# Patient Record
Sex: Male | Born: 1955 | Race: White | Hispanic: No | Marital: Married | Smoking: Never smoker
Health system: Southern US, Community
[De-identification: ages and names within clinical notes are randomized; demographics above are authoritative.]

## PROBLEM LIST (undated history)

## (undated) DIAGNOSIS — K5790 Diverticulosis of intestine, part unspecified, without perforation or abscess without bleeding: Secondary | ICD-10-CM

## (undated) DIAGNOSIS — G4730 Sleep apnea, unspecified: Secondary | ICD-10-CM

## (undated) DIAGNOSIS — K759 Inflammatory liver disease, unspecified: Secondary | ICD-10-CM

## (undated) DIAGNOSIS — M1990 Unspecified osteoarthritis, unspecified site: Secondary | ICD-10-CM

## (undated) DIAGNOSIS — K859 Acute pancreatitis without necrosis or infection, unspecified: Secondary | ICD-10-CM

## (undated) DIAGNOSIS — I10 Essential (primary) hypertension: Secondary | ICD-10-CM

## (undated) HISTORY — PX: EYE SURGERY: SHX253

## (undated) HISTORY — PX: TONSILLECTOMY: SUR1361

## (undated) HISTORY — PX: KNEE ARTHROSCOPY: SHX127

## (undated) HISTORY — PX: VASECTOMY: SHX75

## (undated) HISTORY — PX: COLONOSCOPY: SHX5424

---

## 1997-09-04 ENCOUNTER — Ambulatory Visit (HOSPITAL_COMMUNITY): Admission: RE | Admit: 1997-09-04 | Discharge: 1997-09-04 | Payer: Self-pay | Admitting: Gastroenterology

## 2000-12-14 ENCOUNTER — Ambulatory Visit (HOSPITAL_COMMUNITY): Admission: RE | Admit: 2000-12-14 | Discharge: 2000-12-14 | Payer: Self-pay | Admitting: Gastroenterology

## 2000-12-15 ENCOUNTER — Encounter: Payer: Self-pay | Admitting: *Deleted

## 2000-12-15 ENCOUNTER — Observation Stay (HOSPITAL_COMMUNITY): Admission: EM | Admit: 2000-12-15 | Discharge: 2000-12-15 | Payer: Self-pay | Admitting: *Deleted

## 2013-03-05 ENCOUNTER — Ambulatory Visit (HOSPITAL_BASED_OUTPATIENT_CLINIC_OR_DEPARTMENT_OTHER): Payer: 59

## 2013-06-03 ENCOUNTER — Ambulatory Visit (HOSPITAL_BASED_OUTPATIENT_CLINIC_OR_DEPARTMENT_OTHER): Payer: 59 | Attending: Family Medicine

## 2013-06-03 DIAGNOSIS — R0683 Snoring: Secondary | ICD-10-CM

## 2013-06-03 DIAGNOSIS — G4733 Obstructive sleep apnea (adult) (pediatric): Secondary | ICD-10-CM | POA: Insufficient documentation

## 2013-06-09 DIAGNOSIS — G4730 Sleep apnea, unspecified: Secondary | ICD-10-CM

## 2013-06-09 DIAGNOSIS — R0989 Other specified symptoms and signs involving the circulatory and respiratory systems: Secondary | ICD-10-CM

## 2013-06-09 DIAGNOSIS — R0609 Other forms of dyspnea: Secondary | ICD-10-CM

## 2013-06-09 DIAGNOSIS — G4710 Hypersomnia, unspecified: Secondary | ICD-10-CM

## 2013-06-09 DIAGNOSIS — G4733 Obstructive sleep apnea (adult) (pediatric): Secondary | ICD-10-CM

## 2013-06-09 NOTE — Sleep Study (Signed)
   NAME: Russell Carney DATE OF BIRTH:  [birth year] MEDICAL RECORD NUMBER [id number]  LOCATION: [facility]  PHYSICIAN: Clinton D Young  DATE OF STUDY: 06/03/2013  SLEEP STUDY TYPE: Nocturnal Polysomnogram               REFERRING PHYSICIAN: Koirala, Dibas, MD  INDICATION FOR STUDY: Hypersomnia with sleep apnea  EPWORTH SLEEPINESS SCORE:   HEIGHT:   69 inches WEIGHT:   235 pounds  BMI 35 NECK SIZE:.  MEDICATIONS: Charted for review  SLEEP ARCHITECTURE: Total sleep time 267 minutes with sleep efficiency 70.6%. Stage I was 12.9%, stage II 76.4%, stage III absent, REM 10.7% of total sleep time. Sleep latency 16 minutes, REM latency 100.5 minutes, awake after sleep onset 95 minutes, arousal index 26.7.  RESPIRATORY DATA: Apnea hypopnea index (AHI) 20.2 per hour. 90 total events scored including 21 obstructive apneas, 1 mixed apnea, 68 hypopneas. Events were seen in all positions, REM AHI 27.4 per hour. There were not enough  early events to permit application of split protocol CPAP titration on the study.  OXYGEN DATA: Loud snoring with oxygen desaturation to a nadir of 83% and mean oxygen saturation through the study of 93.6% on room air.  CARDIAC DATA: Sinus rhythm  MOVEMENT/PARASOMNIA: 75 total limb jerks were counted of which 6 were associated with arousal or awakening for a periodic limb movement with arousal index of 1.3 per hour.  IMPRESSION/ RECOMMENDATION:   1) Moderate obstructive sleep apnea/hypopnea syndrome, AHI 20.2 per hour with non-positional events. REM AHI 27.4 per hour. Loud snoring with oxygen desaturation to a nadir of 83% and mean oxygen saturation through the study of 93.6% on room air  2) There were not enough early events to meet protocol requirements for split CPAP titration. This patient can return for a dedicated CPAP titration study if appropriate.  Signed Baird Lyons M.D. [place], Tax adviser of Sleep  Medicine  ELECTRONICALLY SIGNED ON:  06/09/2013, 1:01 PM [place] PH: [phone]   FX: [phone] [place]

## 2014-03-28 ENCOUNTER — Other Ambulatory Visit: Payer: Self-pay | Admitting: Family Medicine

## 2014-03-28 ENCOUNTER — Ambulatory Visit
Admission: RE | Admit: 2014-03-28 | Discharge: 2014-03-28 | Disposition: A | Discharge status: Home / Self Care | Payer: 59 | Source: Ambulatory Visit | Attending: Family Medicine | Admitting: Family Medicine

## 2014-03-28 DIAGNOSIS — Z01818 Encounter for other preprocedural examination: Secondary | ICD-10-CM

## 2014-04-19 ENCOUNTER — Encounter (HOSPITAL_COMMUNITY): Payer: Self-pay

## 2014-04-19 ENCOUNTER — Encounter (HOSPITAL_COMMUNITY)
Admission: RE | Admit: 2014-04-19 | Discharge: 2014-04-19 | Disposition: A | Discharge status: Home / Self Care | Payer: 59 | Source: Ambulatory Visit | Attending: Orthopedic Surgery | Admitting: Orthopedic Surgery

## 2014-04-19 DIAGNOSIS — I10 Essential (primary) hypertension: Secondary | ICD-10-CM | POA: Diagnosis not present

## 2014-04-19 DIAGNOSIS — Z01812 Encounter for preprocedural laboratory examination: Secondary | ICD-10-CM | POA: Insufficient documentation

## 2014-04-19 HISTORY — DX: Sleep apnea, unspecified: G47.30

## 2014-04-19 HISTORY — DX: Unspecified osteoarthritis, unspecified site: M19.90

## 2014-04-19 HISTORY — DX: Inflammatory liver disease, unspecified: K75.9

## 2014-04-19 HISTORY — DX: Essential (primary) hypertension: I10

## 2014-04-19 LAB — CBC
HCT: 45.1 % (ref 39.0–52.0)
Hemoglobin: 16.2 g/dL (ref 13.0–17.0)
MCH: 31.3 pg (ref 26.0–34.0)
MCHC: 35.9 g/dL (ref 30.0–36.0)
MCV: 87.2 fL (ref 78.0–100.0)
PLATELETS: 161 10*3/uL (ref 150–400)
RBC: 5.17 MIL/uL (ref 4.22–5.81)
RDW: 13.5 % (ref 11.5–15.5)
WBC: 5.7 10*3/uL (ref 4.0–10.5)

## 2014-04-19 LAB — BASIC METABOLIC PANEL
ANION GAP: 12 (ref 5–15)
BUN: 12 mg/dL (ref 6–23)
CALCIUM: 9.6 mg/dL (ref 8.4–10.5)
CO2: 24 mmol/L (ref 19–32)
CREATININE: 1 mg/dL (ref 0.50–1.35)
Chloride: 104 mmol/L (ref 96–112)
GFR calc non Af Amer: 80 mL/min — ABNORMAL LOW (ref >90)
Glucose, Bld: 125 mg/dL — ABNORMAL HIGH (ref 70–99)
Potassium: 3.9 mmol/L (ref 3.5–5.1)
SODIUM: 140 mmol/L (ref 135–145)

## 2014-04-19 LAB — SURGICAL PCR SCREEN
MRSA, PCR: NEGATIVE
STAPHYLOCOCCUS AUREUS: NEGATIVE

## 2014-04-19 NOTE — Progress Notes (Signed)
From what the PCP told the patient, that from his ekg, it looked like the left side of his heart was pumping, but there was a delay on the right side?  He was then sent over to see Dr. Christen Butter, patient thinks last Friday and was given the 'green' light to have surgery.   I have requested  those records.  [phone]

## 2014-04-19 NOTE — Pre-Procedure Instructions (Addendum)
Russell Carney  04/19/2014   Your procedure is scheduled on: Wednesday, April 6th   Report to [facility] Admitting at 11:30 AM.  Call this number if you have problems the morning of surgery: [phone]   Remember:   Do not eat food or drink liquids after midnight Tuesday.   Take these medicines the morning of surgery with A SIP OF WATER: Valium, Hydromorphone.   Do not wear jewelry - no rings or watches.  Do not wear lotions or colognes.  You may NOT wear deodorant the day of surgery. .   Men may shave face and neck.  Do not bring valuables to the hospital.  [facility] is not responsible for any belongings or valuables.               Contacts, dentures or bridgework may not be worn into surgery.  Leave suitcase in the car. After surgery it may be brought to your room.  For patients admitted to the hospital, discharge time is determined by your treatment team                [place]                Name and phone number of your driver:    Special Instructions: "Preparing for Surgery" instruction sheet.   Please read over the following fact sheets that you were given: Pain Booklet, Coughing and Deep Breathing, MRSA Information and Surgical Site Infection Prevention

## 2014-04-22 ENCOUNTER — Encounter (HOSPITAL_COMMUNITY): Payer: Self-pay

## 2014-04-22 NOTE — Progress Notes (Signed)
Anesthesia Chart Review:  Patient is a 59 year old male scheduled for C6-7 ACDF on 04/24/14 by Dr. Rolena Infante.  History includes non-smoker, HTN, OSA with CPAP, arthritis, hepatitis A. WT 100KG. PCP is Dr. Dorthy Cooler.  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 Patient was asymptomatic from a CV standpoint.  Case was discussed with Dr. Einar Gip who felt there was no contraindication for surgery, but would plan to see him in follow-up in 6 months.  04/06/14 EKG [facility]): SR, right BBB, LAD/LAFB, T wave inversion in lead III (non-specific).  Preoperative CXR and labs noted.  If no acute changes then I anticipate that he can proceed as planned.  George Hugh [facility]/Anesthesiology Phone [phone] 04/22/2014 10:05 AM

## 2014-04-23 MED ORDER — CEFAZOLIN SODIUM-DEXTROSE 2-3 GM-% IV SOLR
2.0000 g | INTRAVENOUS | Status: AC
  Administered 2014-04-24: 2 g via INTRAVENOUS

## 2014-04-24 ENCOUNTER — Observation Stay (HOSPITAL_COMMUNITY): Payer: 59

## 2014-04-24 ENCOUNTER — Ambulatory Visit (HOSPITAL_COMMUNITY)
Admission: RE | Admit: 2014-04-24 | Discharge: 2014-04-25 | Disposition: A | Discharge status: Home / Self Care | Payer: 59 | Source: Ambulatory Visit | Attending: Orthopedic Surgery | Admitting: Orthopedic Surgery

## 2014-04-24 ENCOUNTER — Encounter (HOSPITAL_COMMUNITY): Payer: Self-pay | Admitting: *Deleted

## 2014-04-24 ENCOUNTER — Encounter (HOSPITAL_COMMUNITY)
Admission: RE | Disposition: A | Discharge status: Home / Self Care | Payer: Self-pay | Source: Ambulatory Visit | Attending: Orthopedic Surgery

## 2014-04-24 ENCOUNTER — Ambulatory Visit (HOSPITAL_COMMUNITY): Payer: 59

## 2014-04-24 ENCOUNTER — Ambulatory Visit (HOSPITAL_COMMUNITY): Payer: 59 | Admitting: Vascular Surgery

## 2014-04-24 ENCOUNTER — Ambulatory Visit (HOSPITAL_COMMUNITY): Payer: 59 | Admitting: Anesthesiology

## 2014-04-24 DIAGNOSIS — M5012 Cervical disc disorder with radiculopathy, mid-cervical region: Secondary | ICD-10-CM | POA: Insufficient documentation

## 2014-04-24 DIAGNOSIS — Z888 Allergy status to other drugs, medicaments and biological substances status: Secondary | ICD-10-CM | POA: Insufficient documentation

## 2014-04-24 DIAGNOSIS — G4730 Sleep apnea, unspecified: Secondary | ICD-10-CM | POA: Diagnosis not present

## 2014-04-24 DIAGNOSIS — I10 Essential (primary) hypertension: Secondary | ICD-10-CM | POA: Diagnosis not present

## 2014-04-24 DIAGNOSIS — Z981 Arthrodesis status: Secondary | ICD-10-CM

## 2014-04-24 DIAGNOSIS — M13862 Other specified arthritis, left knee: Secondary | ICD-10-CM | POA: Insufficient documentation

## 2014-04-24 DIAGNOSIS — Z419 Encounter for procedure for purposes other than remedying health state, unspecified: Secondary | ICD-10-CM

## 2014-04-24 DIAGNOSIS — M4722 Other spondylosis with radiculopathy, cervical region: Secondary | ICD-10-CM | POA: Insufficient documentation

## 2014-04-24 DIAGNOSIS — M13861 Other specified arthritis, right knee: Secondary | ICD-10-CM | POA: Insufficient documentation

## 2014-04-24 DIAGNOSIS — M542 Cervicalgia: Secondary | ICD-10-CM | POA: Diagnosis present

## 2014-04-24 HISTORY — PX: CERVICAL DISCECTOMY: SHX98

## 2014-04-24 HISTORY — PX: ANTERIOR CERVICAL DECOMP/DISCECTOMY FUSION: SHX1161

## 2014-04-24 SURGERY — ANTERIOR CERVICAL DECOMPRESSION/DISCECTOMY FUSION 1 LEVEL
Anesthesia: General | Site: Neck

## 2014-04-24 MED ORDER — ONDANSETRON HCL 4 MG/2ML IJ SOLN: 4.0000 mg | Freq: Once | INTRAMUSCULAR | Status: DC | PRN

## 2014-04-24 MED ORDER — PROPOFOL 10 MG/ML IV BOLUS
INTRAVENOUS | Status: DC | PRN
  Administered 2014-04-24: 180 mg via INTRAVENOUS

## 2014-04-24 MED ORDER — METHOCARBAMOL 500 MG PO TABS: 500.0000 mg | ORAL_TABLET | Freq: Four times a day (QID) | ORAL | Status: DC | PRN

## 2014-04-24 MED ORDER — FENTANYL CITRATE 0.05 MG/ML IJ SOLN
INTRAMUSCULAR | Status: DC | PRN
  Administered 2014-04-24: 100 ug via INTRAVENOUS

## 2014-04-24 MED ORDER — LACTATED RINGERS IV SOLN: INTRAVENOUS | Status: DC

## 2014-04-24 MED ORDER — MIDAZOLAM HCL 5 MG/5ML IJ SOLN
INTRAMUSCULAR | Status: DC | PRN
  Administered 2014-04-24: 2 mg via INTRAVENOUS

## 2014-04-24 MED ORDER — PROPOFOL 10 MG/ML IV BOLUS
INTRAVENOUS | Status: AC
  Filled 2014-04-24: qty 20

## 2014-04-24 MED ORDER — THROMBIN 20000 UNITS EX SOLR
CUTANEOUS | Status: AC
  Filled 2014-04-24: qty 20000

## 2014-04-24 MED ORDER — ACETAMINOPHEN 10 MG/ML IV SOLN
1000.0000 mg | Freq: Four times a day (QID) | INTRAVENOUS | Status: DC
  Administered 2014-04-24 – 2014-04-25 (×3): 1000 mg via INTRAVENOUS
  Filled 2014-04-24 (×3): qty 100

## 2014-04-24 MED ORDER — LACTATED RINGERS IV SOLN
INTRAVENOUS | Status: DC | PRN
  Administered 2014-04-24: 14:00:00 via INTRAVENOUS

## 2014-04-24 MED ORDER — PHENYLEPHRINE 40 MCG/ML (10ML) SYRINGE FOR IV PUSH (FOR BLOOD PRESSURE SUPPORT)
PREFILLED_SYRINGE | INTRAVENOUS | Status: AC
  Filled 2014-04-24: qty 10

## 2014-04-24 MED ORDER — DEXAMETHASONE SODIUM PHOSPHATE 4 MG/ML IJ SOLN
INTRAMUSCULAR | Status: DC | PRN
  Administered 2014-04-24: 4 mg via INTRAVENOUS

## 2014-04-24 MED ORDER — MORPHINE SULFATE 2 MG/ML IJ SOLN: 1.0000 mg | INTRAMUSCULAR | Status: DC | PRN

## 2014-04-24 MED ORDER — THROMBIN 20000 UNITS EX SOLR
CUTANEOUS | Status: DC | PRN
  Administered 2014-04-24: 20000 [IU] via TOPICAL

## 2014-04-24 MED ORDER — HYDROMORPHONE HCL 1 MG/ML IJ SOLN
INTRAMUSCULAR | Status: AC
  Filled 2014-04-24: qty 1

## 2014-04-24 MED ORDER — SODIUM CHLORIDE 0.9 % IV SOLN: 250.0000 mL | INTRAVENOUS | Status: DC

## 2014-04-24 MED ORDER — DEXAMETHASONE SODIUM PHOSPHATE 4 MG/ML IJ SOLN
INTRAMUSCULAR | Status: AC
  Filled 2014-04-24: qty 1

## 2014-04-24 MED ORDER — OXYCODONE-ACETAMINOPHEN 10-325 MG PO TABS: 1.0000 | ORAL_TABLET | ORAL | Status: DC | PRN

## 2014-04-24 MED ORDER — DOCUSATE SODIUM 100 MG PO CAPS: 100.0000 mg | ORAL_CAPSULE | Freq: Three times a day (TID) | ORAL | Status: DC | PRN

## 2014-04-24 MED ORDER — HYDROMORPHONE HCL 1 MG/ML IJ SOLN
0.2500 mg | INTRAMUSCULAR | Status: DC | PRN
  Administered 2014-04-24 (×2): 0.5 mg via INTRAVENOUS

## 2014-04-24 MED ORDER — OXYCODONE HCL 5 MG PO TABS
10.0000 mg | ORAL_TABLET | ORAL | Status: DC | PRN
  Administered 2014-04-24 – 2014-04-25 (×2): 10 mg via ORAL
  Filled 2014-04-24: qty 2

## 2014-04-24 MED ORDER — OXYCODONE HCL 5 MG/5ML PO SOLN
ORAL | Status: AC
  Filled 2014-04-24: qty 10

## 2014-04-24 MED ORDER — CEFAZOLIN SODIUM 1-5 GM-% IV SOLN
1.0000 g | Freq: Three times a day (TID) | INTRAVENOUS | Status: AC
  Administered 2014-04-24 – 2014-04-25 (×2): 1 g via INTRAVENOUS
  Filled 2014-04-24 (×2): qty 50

## 2014-04-24 MED ORDER — ROCURONIUM BROMIDE 100 MG/10ML IV SOLN
INTRAVENOUS | Status: DC | PRN
  Administered 2014-04-24: 50 mg via INTRAVENOUS

## 2014-04-24 MED ORDER — LIDOCAINE HCL (CARDIAC) 20 MG/ML IV SOLN
INTRAVENOUS | Status: DC | PRN
  Administered 2014-04-24: 50 mg via INTRAVENOUS

## 2014-04-24 MED ORDER — DEXAMETHASONE SODIUM PHOSPHATE 4 MG/ML IJ SOLN
4.0000 mg | Freq: Four times a day (QID) | INTRAMUSCULAR | Status: DC
  Administered 2014-04-24 – 2014-04-25 (×3): 4 mg via INTRAVENOUS
  Filled 2014-04-24 (×3): qty 1

## 2014-04-24 MED ORDER — 0.9 % SODIUM CHLORIDE (POUR BTL) OPTIME
TOPICAL | Status: DC | PRN
  Administered 2014-04-24: 1000 mL

## 2014-04-24 MED ORDER — MIDAZOLAM HCL 2 MG/2ML IJ SOLN
INTRAMUSCULAR | Status: AC
  Filled 2014-04-24: qty 2

## 2014-04-24 MED ORDER — MENTHOL 3 MG MT LOZG
1.0000 | LOZENGE | OROMUCOSAL | Status: DC | PRN
  Filled 2014-04-24: qty 9

## 2014-04-24 MED ORDER — METHOCARBAMOL 500 MG PO TABS: 500.0000 mg | ORAL_TABLET | Freq: Three times a day (TID) | ORAL | Status: DC | PRN

## 2014-04-24 MED ORDER — OXYCODONE HCL 5 MG PO TABS: 5.0000 mg | ORAL_TABLET | Freq: Once | ORAL | Status: DC | PRN

## 2014-04-24 MED ORDER — SODIUM CHLORIDE 0.9 % IJ SOLN: 3.0000 mL | Freq: Two times a day (BID) | INTRAMUSCULAR | Status: DC

## 2014-04-24 MED ORDER — HYDROCHLOROTHIAZIDE 25 MG PO TABS
25.0000 mg | ORAL_TABLET | Freq: Every day | ORAL | Status: DC
  Administered 2014-04-25: 25 mg via ORAL

## 2014-04-24 MED ORDER — SIMVASTATIN 20 MG PO TABS
20.0000 mg | ORAL_TABLET | Freq: Every day | ORAL | Status: DC
  Administered 2014-04-24: 20 mg via ORAL
  Filled 2014-04-24: qty 1

## 2014-04-24 MED ORDER — PHENYLEPHRINE HCL 10 MG/ML IJ SOLN
INTRAMUSCULAR | Status: DC | PRN
  Administered 2014-04-24 (×6): 80 ug via INTRAVENOUS

## 2014-04-24 MED ORDER — LOSARTAN POTASSIUM 25 MG PO TABS
25.0000 mg | ORAL_TABLET | Freq: Every day | ORAL | Status: DC
  Administered 2014-04-25: 25 mg via ORAL
  Filled 2014-04-24: qty 1

## 2014-04-24 MED ORDER — DEXAMETHASONE 4 MG PO TABS
4.0000 mg | ORAL_TABLET | Freq: Four times a day (QID) | ORAL | Status: DC
  Administered 2014-04-25: 4 mg via ORAL
  Filled 2014-04-24: qty 1

## 2014-04-24 MED ORDER — EPHEDRINE SULFATE 50 MG/ML IJ SOLN
INTRAMUSCULAR | Status: AC
  Filled 2014-04-24: qty 1

## 2014-04-24 MED ORDER — ACETAMINOPHEN 10 MG/ML IV SOLN
1000.0000 mg | INTRAVENOUS | Status: AC
  Administered 2014-04-24: 1000 mg via INTRAVENOUS
  Filled 2014-04-24: qty 100

## 2014-04-24 MED ORDER — EPHEDRINE SULFATE 50 MG/ML IJ SOLN
INTRAMUSCULAR | Status: DC | PRN
  Administered 2014-04-24 (×2): 10 mg via INTRAVENOUS

## 2014-04-24 MED ORDER — ONDANSETRON HCL 4 MG/2ML IJ SOLN
INTRAMUSCULAR | Status: AC
  Filled 2014-04-24: qty 2

## 2014-04-24 MED ORDER — PHENOL 1.4 % MT LIQD
1.0000 | OROMUCOSAL | Status: DC | PRN
  Administered 2014-04-24: 1 via OROMUCOSAL
  Filled 2014-04-24: qty 177

## 2014-04-24 MED ORDER — BUPIVACAINE-EPINEPHRINE (PF) 0.25% -1:200000 IJ SOLN
INTRAMUSCULAR | Status: AC
  Filled 2014-04-24: qty 30

## 2014-04-24 MED ORDER — CEFAZOLIN SODIUM-DEXTROSE 2-3 GM-% IV SOLR
INTRAVENOUS | Status: AC
  Filled 2014-04-24: qty 50

## 2014-04-24 MED ORDER — ONDANSETRON HCL 4 MG/2ML IJ SOLN
INTRAMUSCULAR | Status: DC | PRN
  Administered 2014-04-24: 4 mg via INTRAVENOUS

## 2014-04-24 MED ORDER — BUPIVACAINE-EPINEPHRINE 0.25% -1:200000 IJ SOLN
INTRAMUSCULAR | Status: DC | PRN
  Administered 2014-04-24: 5 mL

## 2014-04-24 MED ORDER — FENTANYL CITRATE 0.05 MG/ML IJ SOLN
INTRAMUSCULAR | Status: AC
  Filled 2014-04-24: qty 5

## 2014-04-24 MED ORDER — LACTATED RINGERS IV SOLN
INTRAVENOUS | Status: DC
  Administered 2014-04-24 (×2): via INTRAVENOUS

## 2014-04-24 MED ORDER — OXYCODONE HCL 5 MG/5ML PO SOLN: 5.0000 mg | Freq: Once | ORAL | Status: DC | PRN

## 2014-04-24 MED ORDER — ONDANSETRON HCL 4 MG/2ML IJ SOLN
4.0000 mg | INTRAMUSCULAR | Status: DC | PRN
  Administered 2014-04-24: 4 mg via INTRAVENOUS
  Filled 2014-04-24: qty 2

## 2014-04-24 MED ORDER — ONDANSETRON HCL 4 MG PO TABS: 4.0000 mg | ORAL_TABLET | Freq: Three times a day (TID) | ORAL | Status: DC | PRN

## 2014-04-24 MED ORDER — METHOCARBAMOL 1000 MG/10ML IJ SOLN
500.0000 mg | Freq: Four times a day (QID) | INTRAVENOUS | Status: DC | PRN
  Filled 2014-04-24: qty 5

## 2014-04-24 MED ORDER — SODIUM CHLORIDE 0.9 % IJ SOLN: 3.0000 mL | INTRAMUSCULAR | Status: DC | PRN

## 2014-04-24 SURGICAL SUPPLY — 62 items
BLADE SURG ROTATE 9660 (MISCELLANEOUS) IMPLANT
BUR EGG ELITE 4.0 (BURR) IMPLANT
BUR MATCHSTICK NEURO 3.0 LAGG (BURR) IMPLANT
CANISTER SUCTION 2500CC (MISCELLANEOUS) ×2 IMPLANT
CLSR STERI-STRIP ANTIMIC 1/2X4 (GAUZE/BANDAGES/DRESSINGS) ×2 IMPLANT
CORDS BIPOLAR (ELECTRODE) ×2 IMPLANT
COVER SURGICAL LIGHT HANDLE (MISCELLANEOUS) ×4 IMPLANT
CRADLE DONUT ADULT HEAD (MISCELLANEOUS) ×2 IMPLANT
DECANTER SPIKE VIAL GLASS SM (MISCELLANEOUS) ×1 IMPLANT
DRAPE C-ARM 42X72 X-RAY (DRAPES) ×2 IMPLANT
DRAPE POUCH INSTRU U-SHP 10X18 (DRAPES) ×2 IMPLANT
DRAPE SURG 17X23 STRL (DRAPES) ×2 IMPLANT
DRAPE U-SHAPE 47X51 STRL (DRAPES) ×2 IMPLANT
DRSG MEPILEX BORDER 4X4 (GAUZE/BANDAGES/DRESSINGS) ×2 IMPLANT
DURAPREP 26ML APPLICATOR (WOUND CARE) ×2 IMPLANT
ELECT COATED BLADE 2.86 ST (ELECTRODE) ×2 IMPLANT
ELECT PENCIL ROCKER SW 15FT (MISCELLANEOUS) ×2 IMPLANT
ELECT REM PT RETURN 9FT ADLT (ELECTROSURGICAL) ×2
ELECTRODE REM PT RTRN 9FT ADLT (ELECTROSURGICAL) ×1 IMPLANT
ENDOSKELETON LG TC 6VBR 8MM (Orthopedic Implant) ×1 IMPLANT
GLOVE BIOGEL PI IND STRL 8 (GLOVE) ×1 IMPLANT
GLOVE BIOGEL PI IND STRL 8.5 (GLOVE) ×1 IMPLANT
GLOVE BIOGEL PI INDICATOR 8 (GLOVE) ×1
GLOVE BIOGEL PI INDICATOR 8.5 (GLOVE) ×1
GLOVE ORTHO TXT STRL SZ7.5 (GLOVE) ×2 IMPLANT
GLOVE SS BIOGEL STRL SZ 8.5 (GLOVE) ×1 IMPLANT
GLOVE SUPERSENSE BIOGEL SZ 8.5 (GLOVE) ×1
GOWN STRL REUS W/ TWL XL LVL3 (GOWN DISPOSABLE) ×1 IMPLANT
GOWN STRL REUS W/TWL 2XL LVL3 (GOWN DISPOSABLE) ×4 IMPLANT
GOWN STRL REUS W/TWL XL LVL3 (GOWN DISPOSABLE) ×2
KIT BASIN OR (CUSTOM PROCEDURE TRAY) ×2 IMPLANT
KIT ROOM TURNOVER OR (KITS) ×2 IMPLANT
NDL SPNL 18GX3.5 QUINCKE PK (NEEDLE) ×1 IMPLANT
NEEDLE SPNL 18GX3.5 QUINCKE PK (NEEDLE) ×2 IMPLANT
NS IRRIG 1000ML POUR BTL (IV SOLUTION) ×2 IMPLANT
PACK ORTHO CERVICAL (CUSTOM PROCEDURE TRAY) ×2 IMPLANT
PACK UNIVERSAL I (CUSTOM PROCEDURE TRAY) ×2 IMPLANT
PAD ARMBOARD 7.5X6 YLW CONV (MISCELLANEOUS) ×4 IMPLANT
PATTIES SURGICAL .25X.25 (GAUZE/BANDAGES/DRESSINGS) IMPLANT
PIN DISTRACTION 14 (PIN) ×1 IMPLANT
PIN RETAINER PRODISC 14 MM (PIN) ×1 IMPLANT
PLATE ONE LEVEL SKYLINE 14MM (Plate) ×1 IMPLANT
PUTTY BONE DBX 2.5 MIS (Bone Implant) ×1 IMPLANT
RESTRAINT LIMB HOLDER UNIV (RESTRAINTS) ×2 IMPLANT
SCREW SKYLINE 14MM SD-VA (Screw) ×4 IMPLANT
SPONGE INTESTINAL PEANUT (DISPOSABLE) ×2 IMPLANT
SPONGE LAP 18X18 X RAY DECT (DISPOSABLE) ×1 IMPLANT
SPONGE SURGIFOAM ABS GEL 100 (HEMOSTASIS) ×2 IMPLANT
SURGIFLO TRUKIT (HEMOSTASIS) IMPLANT
SUT BONE WAX W31G (SUTURE) ×2 IMPLANT
SUT MON AB 3-0 SH 27 (SUTURE) ×2
SUT MON AB 3-0 SH27 (SUTURE) ×1 IMPLANT
SUT SILK 2 0 (SUTURE)
SUT SILK 2-0 18XBRD TIE 12 (SUTURE) IMPLANT
SUT VIC AB 2-0 CT1 18 (SUTURE) ×2 IMPLANT
SYR BULB IRRIGATION 50ML (SYRINGE) ×2 IMPLANT
SYR CONTROL 10ML LL (SYRINGE) ×2 IMPLANT
TAPE CLOTH 4X10 WHT NS (GAUZE/BANDAGES/DRESSINGS) ×2 IMPLANT
TAPE UMBILICAL COTTON 1/8X30 (MISCELLANEOUS) ×2 IMPLANT
TOWEL OR 17X24 6PK STRL BLUE (TOWEL DISPOSABLE) ×2 IMPLANT
TOWEL OR 17X26 10 PK STRL BLUE (TOWEL DISPOSABLE) ×2 IMPLANT
WATER STERILE IRR 1000ML POUR (IV SOLUTION) ×2 IMPLANT

## 2014-04-24 NOTE — Progress Notes (Signed)
Placed patient on home CPAP machine for the night.

## 2014-04-24 NOTE — Transfer of Care (Signed)
Immediate Anesthesia Transfer of Care Note  Patient: Russell Carney  Procedure(s) Performed: Procedure(s): ANTERIOR CERVICAL DISCECTOMY FUSION C6 - C7 1 LEVEL (N/A)  Patient Location: PACU  Anesthesia Type:General  Level of Consciousness: awake and alert   Airway & Oxygen Therapy: Patient Spontanous Breathing and Patient connected to nasal cannula oxygen  Post-op Assessment: Report given to RN and Post -op Vital signs reviewed and stable  Post vital signs: Reviewed and stable  Last Vitals:  Filed Vitals:   04/24/14 1600  BP: 124/60  Pulse: 85  Temp: 36.4 C  Resp: 18    Complications: No apparent anesthesia complications

## 2014-04-24 NOTE — Anesthesia Postprocedure Evaluation (Signed)
  Anesthesia Post-op Note  Patient: Russell Carney  Procedure(s) Performed: Procedure(s): ANTERIOR CERVICAL DISCECTOMY FUSION C6 - C7 1 LEVEL (N/A)  Patient Location: PACU  Anesthesia Type:General  Level of Consciousness: awake and alert   Airway and Oxygen Therapy: Patient Spontanous Breathing and Patient connected to nasal cannula oxygen  Post-op Pain: none  Post-op Assessment: Post-op Vital signs reviewed and Patient's Cardiovascular Status Stable  Post-op Vital Signs: Reviewed and stable  Last Vitals:  Filed Vitals:   04/24/14 1600  BP: 124/60  Pulse: 85  Temp: 36.4 C  Resp: 18    Complications: No apparent anesthesia complications

## 2014-04-24 NOTE — Discharge Instructions (Signed)

## 2014-04-24 NOTE — H&P (Signed)
History of Present Illness The patient is a 59 year old male who presents with neck pain. The patient reports symptoms involving the right shoulder blade ago. The symptoms began without any known injury (that began in mid January). Symptoms include neck stiffness, shoulder pain, tingling (right hand) and upper extremity weakness, while symptoms do not include neck pain ("my pain is in y right arm that is painful, but not as bad as it was") or headaches. The pain radiates to the right shoulder, right upper arm and right forearm. The patient describes the pain as sharp (in the arm).The patient describes their symptoms as mild.The patient does feel that the symptoms are improving. Symptoms are exacerbated by use of the right arm. Current treatment includes nonsteroidal anti-inflammatory drugs (ibuprofen). Prior to being seen today the patient was previously evaluated in this clinic. Past evaluation has included cervical spine MRI (@ [facility]). Past treatment has included nonsteroidal anti-inflammatory drugs, ice and spinal injections.  Additional reasons for visit:  H & P is described as the following: The patient is scheduled for a ACDF C6-7 to be performed by Dr. Duane Lope D. Rolena Infante, MD at [facility] on 04/24/14 . Please see the hospital record for complete dictated history and physical.  Allergies ) No Known Drug Allergies03/16/2013  Family History  Severe allergy mother Cancer First Degree Relatives. father and sister Congestive Heart Failure mother and father Diabetes Mellitus father Heart Disease mother  Social History  Tobacco use Never smoker. never smoker Exercise Exercises daily; does running / walking, individual sport and gym / weights Pain Contract no Drug/Alcohol Rehab (Previously) no Drug/Alcohol Rehab (Currently) no Current work status working full time Illicit drug use no Number of flights of stairs before winded greater than 5 Marital status  married Children 1 Alcohol use current drinker; only occasionally per week Living situation live with spouse  Medication History  Valium (5MG  Tablet, Oral) Active. (qhs) Multiple Vitamin (1 Oral) Active. Olive Leaf (250MG  Capsule, Oral) Active. Vitamin C (500MG  Tablet, 1 Oral) Active. Simvastatin (20MG  Tablet, Oral) Active. (QD) Hydrochlorothiazide (25MG  Tablet, Oral) Active. (QD) Zolpidem Tartrate (10MG  Tablet, Oral as needed) Active. Medications Reconciled  Vitals  04/23/2014 2:40 PM Temp.: 98.48F    04/23/2014 2:40 PM Weight: 235 lb Height: 69in Body Surface Area: 2.21 m Body Mass Index: 34.7 kg/m  Pulse: 100 (Regular)  BP: 124/73 (Sitting, Left Arm, Standard)  Physical Exam  General Mental Status -Alert.  Chest and Lung Exam Examination of related systems reveals-well-developed, well-nourished and in no acute distress; alert and oriented x 3. Chest and lung exam reveals -normal excursion with symmetric chest walls.  Cardiovascular Cardiovascular examination reveals -on palpation PMI is normal in location and amplitude, no palpable S3 or S4. Normal cardiac borders. and normal heart sounds, regular rate and rhythm with no murmurs.  Peripheral Vascular Lower Extremity Palpation - Calf - Bilateral - soft/supple to palpation, Non Tender, appearance is not suggestive of DVT.  Neurologic Testing Hoffman's Sign - No Hoffman's sign present. 1+ DTR throughout Neg babinski Nl gait pattern  Musculoskeletal Spine/Ribs/Pelvis Cervical Spine - Evaluation of related systems reveals - well developed, well nourished and in no acute distress and alert and oriented X3. Assessment of pain reveals the following findings: - The pain is characterized as - severe, aching, burning and radiating. Location - upper trapezius area, (R), right arm and right forearm. Cervical Spine - ROM - limited and painful. ROM - Testing limited - due to apprehension. Upper  Extremity  Right Upper  Extremity: Strength - Triceps - painful(He continues to have C7 radicular pain, numbness and weakness of his right triceps. He continues to have neck pain. Clinically he notes a significant improvement after his C7 selective nerve root block. He is able to sleep, lay down.).  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) Story: Plan on ACDF C6/7 for right C7 radiculopathy Current Plans Pt Education - Medicines: Using Them Safely: medications Pt Education - Wound Closure and Wound Care: surgery Pt Education - Ice Therapy: ice therapy Anterior cervical fusion:Risks of surgery include, but are not limited to: Throat pain, swallowing difficulty, hoarseness or change in voice, death, stroke, paralysis, nerve root damage/injury, bleeding, blood clots, loss of bowel/bladder control, hardware failure, or mal-position, spinal fluid leak, adjacent segment disease, non-union, need for further surgery, ongoing or worse pain, infection. Post-operative bleeding or swelling that could require emergent surgery. Goal Of Surgery:Discussed that goal of surgery is to reduce pain and improve function and quality of life. Patient is aware that despite all appropriate treatment that there pain and function could be the same, worse, or different. Degenerative cervical disc

## 2014-04-24 NOTE — Anesthesia Preprocedure Evaluation (Signed)
Anesthesia Evaluation  Patient identified by MRN, date of birth, ID band Patient awake    Reviewed: Allergy & Precautions, NPO status , Patient's Chart, lab work & pertinent test results, reviewed documented beta blocker date and time   Airway Mallampati: III  TM Distance: >3 FB Neck ROM: Full  Mouth opening: Limited Mouth Opening  Dental  (+) Teeth Intact, Dental Advisory Given   Pulmonary  breath sounds clear to auscultation        Cardiovascular hypertension, Rhythm:Regular Rate:Normal     Neuro/Psych    GI/Hepatic   Endo/Other    Renal/GU      Musculoskeletal   Abdominal   Peds  Hematology   Anesthesia Other Findings   Reproductive/Obstetrics                             Anesthesia Physical Anesthesia Plan  ASA: II  Anesthesia Plan: General   Post-op Pain Management:    Induction: Intravenous  Airway Management Planned: Oral ETT and Video Laryngoscope Planned  Additional Equipment:   Intra-op Plan:   Post-operative Plan: Extubation in OR  Informed Consent: I have reviewed the patients History and Physical, chart, labs and discussed the procedure including the risks, benefits and alternatives for the proposed anesthesia with the patient or authorized representative who has indicated his/her understanding and acceptance.   Dental advisory given  Plan Discussed with: CRNA and Anesthesiologist  Anesthesia Plan Comments: (HNP C6-7 Hypertension Sleep apnea well controlled on CPAP Possible difficult airway  Plan GA with poral ETT and Glide scope  Roberts Gaudy)        Anesthesia Quick Evaluation

## 2014-04-24 NOTE — Brief Op Note (Signed)
04/24/2014  3:48 PM  PATIENT:  Rudy Jew  59 y.o. male  PRE-OPERATIVE DIAGNOSIS:  C7 Radiculopthy  POST-OPERATIVE DIAGNOSIS:  C7 Radiculopthy  PROCEDURE:  Procedure(s): ANTERIOR CERVICAL DISCECTOMY FUSION C6 - C7 1 LEVEL (N/A)  SURGEON:  Surgeon(s) and Role:    * Melina Schools, MD - Primary  PHYSICIAN ASSISTANT:   ASSISTANTS: none   ANESTHESIA:   general  EBL:     BLOOD ADMINISTERED:none  DRAINS: none   LOCAL MEDICATIONS USED:  MARCAINE     SPECIMEN:  No Specimen  DISPOSITION OF SPECIMEN:  PATHOLOGY  COUNTS:  YES  TOURNIQUET:  * No tourniquets in log *  DICTATION: .Other Dictation: Dictation Number [id number]  PLAN OF CARE: Admit for overnight observation  PATIENT DISPOSITION:  PACU - hemodynamically stable.

## 2014-04-25 ENCOUNTER — Encounter (HOSPITAL_COMMUNITY): Payer: Self-pay | Admitting: General Practice

## 2014-04-25 DIAGNOSIS — M4722 Other spondylosis with radiculopathy, cervical region: Secondary | ICD-10-CM | POA: Diagnosis not present

## 2014-04-25 MED ORDER — ACETAMINOPHEN 500 MG PO TABS
1000.0000 mg | ORAL_TABLET | Freq: Once | ORAL | Status: AC
  Administered 2014-04-25: 1000 mg via ORAL
  Filled 2014-04-25: qty 2

## 2014-04-25 NOTE — Op Note (Signed)
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  OPERATIVE PROCEDURE:  Anterior cervical diskectomy and fusion C6-7.  COMPLICATIONS:  None.  INTRAOPERATIVE FINDINGS:  Three small free fragments of disk herniation in the posterolateral right corner with compression of the exiting C7 nerve root consistent with MRI findings.  Used an 8 large Titan titanium intervertebral cage packed with DBX mix and a 14 mm anterior cervical plate from diffuse guideline with 14 mm locking screws.  HISTORY:  This is a very pleasant 59 year old gentleman who is having significant neck and radicular right arm pain in the C7 distribution. The patient had significant relief of his pain temporarily with the C7 injection.  Because of the ongoing loss of quality of life and pain, we elected to proceed with surgery.  All appropriate risks, benefits, and alternatives were discussed with the patient and consent was obtained.  OPERATIVE NOTE:  The patient was brought to the operating room, placed supine on the operating table.  After successful induction of general anesthesia and endotracheal intubation, TEDs, SCDs were applied.  An inflatable cuff was placed on the shoulder blades and the anterior cervical spine was prepped and draped in a standard fashion.  A time-out was taken to confirm patient, procedure, and all other pertinent important data.  Once this was done, I then identified the C6-7 disk space on x-ray and infiltrated  the proposed incision site.  A standard Alben Deeds transverse approach was undertaken.  An incision was made starting in midline proceeding to the left.  Sharp dissection was carried out down to the platysma.  Platysma was sharply incised and exposed the sternocleidomastoid.  I then sharply dissected along the medial border of the sternocleidomastoid through the deep cervical fascia.  I identified the omohyoid and released it from the sling.  I then swept the omohyoid, trachea and esophagus to the right and continued dissecting bluntly down through the prevertebral fascia to the anterior longitudinal ligament.  The carotid sheath was palpated and protected with a finger.  I then placed an appendiceal retractor to protect the esophagus and then used Kittner dissectors to completely expose the anterior longitudinal ligament.  I then placed a needle into the 6-7 disk space, took an x-ray to confirm that I was at the appropriate level.  Once this was confirmed, I then used bipolar electrocautery to mobilize the longus colli muscles from the midbody to 6-7 disk space level.  Once this was done, I then placed the Caspar retracting blades beneath the longus colli muscle, deflated the endotracheal cuff, expanded the retractor and reinflated the cuff.  I now had excellent visualization in the anterior aspect of the C6-7 disk space.  Annulotomy was performed in 6-7  using a 15 blade scalpel.  Using pituitary rongeurs, curettes, and Kerrison rongeurs, I removed all of the disk material at 6-7 disk space level.  Once this was done, I then placed distraction pins into the bodies of 6 and 7, distracted the intervertebral space and then maintained the distraction.  I then used a neuro-curette to begin my posterior diskectomy.  I released the annulus and removed the remaining posterior disk and annulus.  I then used a 1 mm Kerrison to trim down the bone spur at the posterior aspect of the  C7 vertebral body.  At this point, I used a fine nerve hook.  I then identified the area where the anulus was compromised due to the disk herniation.  I exploited this and used a nerve hook to deliver 2 fragments of disk material from the right posterior lateral corner. Once this was done, I then easily had a plane between the thecal sac and the posterior longitudinal ligament.  I then used my 1 mm Kerrison to resect the posterior longitudinal ligament and expose the thecal sac.  I then went underneath the right uncovertebral joint and palpated with a fine nerve hook.  I delivered another fragment of disk material that was underneath the uncovertebral joint.  At this point, I could now freely take my nerve hook underneath the uncovertebral joint and indicating, and adequately decompress the nerve.  I then rasped the endplates, so I had bleeding subchondral bone and I made sure there was no cartilaginous endplate left.  I then measured with trial devices and then elected to use 8 large Titan titanium intervertebral cage.  This was obtained, packed with DBX and then malleted to the appropriate depth.  I had excellent purchase.  It was well seated.  I then removed the distraction pins and sealed the screw hole with bone wax.  Once this was completed, I then placed an anterior cervical plate and secured it with self- drilling screws into the bodies of C6 and C7.  All screws had excellent purchase and then they were locked in place according to manufacture's standards.  The retractors were then removed.  I irrigated the wound copiously with normal saline and made sure I had hemostasis using bipolar electrocautery.  I checked again to make sure the esophagus was not entrapped beneath the plate and then it was returned to midline.  I closed the platysma with interrupted 2-0 Vicryl sutures and the skin with 3-0 Monocryl.  Steri-Strips, dry dressing were applied as was covered.  The patient was  ultimately extubated, transferred to PACU without incident.  At the end of the case, all needle and sponge counts were correct.  There were no adverse intraoperative events.     Dahlia Bailiff, MD     DDB/MEDQ  D:  04/24/2014  T:  04/25/2014  Job:  [id number]  cc:   [place]

## 2014-04-25 NOTE — Care Management Note (Signed)
CARE MANAGEMENT NOTE 04/25/2014  Patient:  Russell Carney, Russell Carney   Account Number:  [id number]  Date Initiated:  04/25/2014  Documentation initiated by:  Ricki Miller  Subjective/Objective Assessment:   59 yr old male admitted with C7 Radiculopathy, patient underwent C6-7 ACDF.     Action/Plan:   Patient has no home health or DME needs. Case manager signed off.   Anticipated DC Date:  04/25/2014   Anticipated DC Plan:  [place]  CM consult      PAC Choice  NA   Choice offered to / List presented to:     DME arranged  NA           Status of service:  Completed, signed off Medicare Important Message given?   (If response is "NO", the following Medicare IM given date fields will be blank) Date Medicare IM given:   Medicare IM given by:   Date Additional Medicare IM given:   Additional Medicare IM given by:    Discharge Disposition:  HOME/SELF CARE

## 2014-04-25 NOTE — Progress Notes (Signed)
    Subjective: Procedure(s) (LRB): ANTERIOR CERVICAL DISCECTOMY FUSION C6 - C7 1 LEVEL (N/A) 1 Day Post-Op  Patient reports pain as 0 on 0-10 scale.  Reports decreased arm pain denies incisional neck pain   Positive void Negative bowel movement Positive flatus Negative chest pain or shortness of breath  Objective: Vital signs in last 24 hours: Temp:  [97.5 F (36.4 C)-98.4 F (36.9 C)] 98.3 F (36.8 C) (04/07 0519) Pulse Rate:  [76-99] 76 (04/07 0519) Resp:  [11-20] 18 (04/07 0519) BP: (103-130)/(57-74) 110/61 mmHg (04/07 0519) SpO2:  [89 %-97 %] 96 % (04/07 0519)  Intake/Output from previous day: 04/06 0701 - 04/07 0700 In: 1100 [I.V.:1100] Out: 500 [Urine:475; Blood:25]  Labs: No results for input(s): WBC, RBC, HCT, PLT in the last 72 hours. No results for input(s): NA, K, CL, CO2, BUN, CREATININE, GLUCOSE, CALCIUM in the last 72 hours. No results for input(s): LABPT, INR in the last 72 hours.  Physical Exam: Neurologically intact ABD soft Intact pulses distally Dorsiflexion/Plantar flexion intact Incision: dressing C/D/I Compartment soft  Assessment/Plan: Patient stable  xrays satisfactory Mobilization with physical therapy Encourage incentive spirometry Continue care  Advance diet Up with therapy D/C IV fluids  D/c to home  Melina Schools, MD [place] [phone]

## 2014-04-25 NOTE — Progress Notes (Signed)
Occupational Therapy Evaluation Patient Details Name: Russell Carney MRN: [id number] DOB: 09/18/1955 Today's Date: 04/25/2014    History of Present Illness s/p C6-7 ACDF   Clinical Impression   Completed all education regarding cervical precautions for ADL, functional mobility and management of cervical collar. Written handout given. Pt/wife able to return demonstrate techniques for ADL. Pt ready for D/C.     Follow Up Recommendations  No OT follow up;Supervision - Intermittent    Equipment Recommendations  None recommended by OT    Recommendations for Other Services       Precautions / Restrictions Precautions Precautions: Cervical Precaution Comments: written handout given Required Braces or Orthoses: Cervical Brace Cervical Brace: Hard collar;At all times;Other (comment) (off for ADL sleeping and eating per Dr. Rolena Infante. order not in chart) Restrictions Weight Bearing Restrictions: No      Mobility Bed Mobility Overal bed mobility: Independent             General bed mobility comments: Educatedon cervical precautions regarding bed mobility  Transfers Overall transfer level: Independent                    Balance Overall balance assessment: No apparent balance deficits (not formally assessed)                                          ADL              Completed all education regarding compensatory techniques for ADL adhering to cervical precautions. Educated pt/family on [employer] collar and techniques for bathing/dressing to limit force on neck. Discussed home safety and reducing risk of falls.                                                  Pertinent Vitals/Pain Pain Assessment: 0-10 Pain Score: 2  Pain Location: neck Pain Descriptors / Indicators: Operative site guarding Pain Intervention(s): Limited activity within patient's tolerance     Hand Dominance Right   Extremity/Trunk  Assessment Upper Extremity Assessment Upper Extremity Assessment: Overall WFL for tasks assessed   Lower Extremity Assessment Lower Extremity Assessment: Overall WFL for tasks assessed   Cervical / Trunk Assessment Cervical / Trunk Assessment: Normal   Communication     Cognition Arousal/Alertness: Awake/alert Behavior During Therapy: WFL for tasks assessed/performed Overall Cognitive Status: Within Functional Limits for tasks assessed                                        Home Living Family/patient expects to be discharged to:: Private residence Living Arrangements: Spouse/significant other Available Help at Discharge: Available 24 hours/day Type of Home: House Home Access: Stairs to enter Technical brewer of Steps: 3 Entrance Stairs-Rails: None Home Layout: One level     Bathroom Shower/Tub: Occupational psychologist: Standard Bathroom Accessibility: Yes How Accessible: Accessible via walker Home Equipment: None          Prior Functioning/Environment Level of Independence: Independent             OT Diagnosis: Acute pain   OT Problem List: Decreased knowledge of precautions;Pain   OT Treatment/Interventions:  OT Goals(Current goals can be found in the care plan section) Acute Rehab OT Goals Patient Stated Goal: to play golf again OT Goal Formulation: All assessment and education complete, DC therapy  OT Frequency:     Barriers to D/C:            Co-evaluation              End of Session Equipment Utilized During Treatment: Cervical collar Nurse Communication: Mobility status;Other (comment) (ready for D/C)  Activity Tolerance: Patient tolerated treatment well Patient left: in bed;with call bell/phone within reach;with family/visitor present   Time: 0340-3524 OT Time Calculation (min): 15 min Charges:  OT General Charges $OT Visit: 1 Procedure OT Evaluation $Initial OT Evaluation Tier I: 1  Procedure G-Codes: OT G-codes **NOT FOR INPATIENT CLASS** Functional Assessment Tool Used: clinical judgement Functional Limitation: Self care Self Care Current Status (E1859): At least 1 percent but less than 20 percent impaired, limited or restricted Self Care Goal Status (M9311): At least 1 percent but less than 20 percent impaired, limited or restricted Self Care Discharge Status [phone]): At least 1 percent but less than 20 percent impaired, limited or restricted  Zyeir Dymek,HILLARY 04/25/2014, 12:18 PM   [facility], OTR/L  [phone] 04/25/2014

## 2014-04-25 NOTE — Discharge Summary (Signed)
Patient ID: Russell Carney MRN: [id number] DOB/AGE: 1955/10/22 59 y.o.  Admit date: 04/24/2014 Discharge date: 04/25/2014  Admission Diagnoses:  Active Problems:   Neck pain   Discharge Diagnoses:  Active Problems:   Neck pain  status post Procedure(s): ANTERIOR CERVICAL DISCECTOMY FUSION C6 - C7 1 LEVEL  Past Medical History  Diagnosis Date  . Hypertension   . Arthritis     in his knees  . 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 Sleep apnea     been tested last yr.  "moderate"  wears the mask    Surgeries: Procedure(s): ANTERIOR CERVICAL DISCECTOMY FUSION C6 - C7 1 LEVEL on 04/24/2014   Consultants:    Discharged Condition: Improved  Hospital Course: Russell Carney is an 59 y.o. male who was admitted 04/24/2014 for operative treatment of cervical disc herniation. Patient failed conservative treatments (please see the history and physical for the specifics) and had severe unremitting pain that affects sleep, daily activities and work/hobbies. After pre-op clearance, the patient was taken to the operating room on 04/24/2014 and underwent  Procedure(s): ANTERIOR CERVICAL DISCECTOMY FUSION C6 - C7 1 LEVEL.    Patient was given perioperative antibiotics: Anti-infectives    Start     Dose/Rate Route Frequency Ordered Stop   04/24/14 2000  ceFAZolin (ANCEF) IVPB 1 g/50 mL premix     1 g 100 mL/hr over 30 Minutes Intravenous Every 8 hours 04/24/14 1806 04/25/14 0452   04/23/14 1329  ceFAZolin (ANCEF) IVPB 2 g/50 mL premix     2 g 100 mL/hr over 30 Minutes Intravenous 30 min pre-op 04/23/14 1329 04/24/14 1401       Patient was given sequential compression devices and early ambulation to prevent DVT.   Patient benefited maximally from hospital stay and there were no complications. At the time of discharge, the patient was urinating/moving their bowels without difficulty, tolerating a regular diet, pain is controlled with oral pain medications and they have been cleared  by PT/OT.   Recent vital signs: Patient Vitals for the past 24 hrs:  BP Temp Temp src Pulse Resp SpO2  04/25/14 0519 110/61 mmHg 98.3 F (36.8 C) Oral 76 18 96 %  04/25/14 0103 103/63 mmHg 98.4 F (36.9 C) Oral 83 17 96 %  04/24/14 1948 130/74 mmHg 97.5 F (36.4 C) Oral 99 17 97 %  04/24/14 1800 129/73 mmHg 98.4 F (36.9 C) Oral 98 - 94 %  04/24/14 1745 126/65 mmHg - - 97 16 (!) 89 %  04/24/14 1736 - - - 78 - 94 %  04/24/14 1730 - - - 95 19 93 %  04/24/14 1715 - - - 90 20 94 %  04/24/14 1700 - - - 86 11 93 %  04/24/14 1645 (!) 126/57 mmHg - - 81 12 94 %  04/24/14 1630 130/66 mmHg - - 84 14 96 %  04/24/14 1615 - - - 82 13 96 %  04/24/14 1600 124/60 mmHg 97.6 F (36.4 C) - 85 18 97 %     Recent laboratory studies: No results for input(s): WBC, HGB, HCT, PLT, NA, K, CL, CO2, BUN, CREATININE, GLUCOSE, INR, CALCIUM in the last 72 hours.  Invalid input(s): PT, 2   Discharge Medications:     Medication List    STOP taking these medications        celecoxib 200 MG capsule  Commonly known as:  CELEBREX  diazepam 5 MG tablet  Commonly known as:  VALIUM     HYDROmorphone 2 MG tablet  Commonly known as:  DILAUDID     ibuprofen 200 MG tablet  Commonly known as:  ADVIL,MOTRIN      TAKE these medications        docusate sodium 100 MG capsule  Commonly known as:  COLACE  Take 1 capsule (100 mg total) by mouth 3 (three) times daily as needed for mild constipation.     hydrochlorothiazide 25 MG tablet  Commonly known as:  HYDRODIURIL  Take 25 mg by mouth daily.     losartan 25 MG tablet  Commonly known as:  COZAAR  Take 25 mg by mouth daily.     methocarbamol 500 MG tablet  Commonly known as:  ROBAXIN  Take 1 tablet (500 mg total) by mouth 3 (three) times daily as needed for muscle spasms.     ondansetron 4 MG tablet  Commonly known as:  ZOFRAN  Take 1 tablet (4 mg total) by mouth every 8 (eight) hours as needed for nausea or vomiting.      oxyCODONE-acetaminophen 10-325 MG per tablet  Commonly known as:  PERCOCET  Take 1 tablet by mouth every 4 (four) hours as needed for pain.     simvastatin 20 MG tablet  Commonly known as:  ZOCOR  Take 20 mg by mouth daily.        Diagnostic Studies: Dg Chest 2 View  03/28/2014   CLINICAL DATA:  Preop chest radiograph.  Cervical fusion.  EXAM: CHEST  2 VIEW  COMPARISON:  None  FINDINGS: The heart size and mediastinal contours are within normal limits. Both lungs are clear. The visualized skeletal structures are unremarkable.  IMPRESSION: No active cardiopulmonary disease.   Electronically Signed   By: Kerby Moors M.D.   On: 03/28/2014 08:59   Dg Cervical Spine 2 Or 3 Views  04/24/2014   CLINICAL DATA:  Spinal fusion, postop.  EXAM: CERVICAL SPINE - 2-3 VIEW  COMPARISON:  Intraoperative fluoroscopy from the same day  FINDINGS: C6-7 anterior cervical discectomy and fusion with ventral plate and screw fixation. The graft and hardware is partially obscured by the shoulders, but there is no evidence of hardware displacement. C7 is essentially obscured laterally, but no evidence of fracture in the frontal projection. No unexpected prevertebral swelling.  IMPRESSION: C6-7 ACDF with plate. There is limited visualization of the fusion due to the shoulders, but no evidence of immediate complication.   Electronically Signed   By: Monte Fantasia M.D.   On: 04/24/2014 18:05   Dg Cervical Spine 2-3 Views  04/24/2014   CLINICAL DATA:  C6-7 ACDF, intraoperative views.  EXAM: CERVICAL SPINE - 2-3 VIEW; DG C-ARM 61-120 MIN  COMPARISON:  None.  FINDINGS: The first 2 images are from a lateral projection in demonstrated anterior plate and screw fixator with intervertebral prosthesis in place at the C6-7 levels, without complicating feature or visible malalignment. C7 vertebral level is relatively indistinct on these views due to the shoulders.  On the frontal radiograph, the plate and screw fixator are midline.   IMPRESSION: 1. C6-7 ACDF without imaging evidence of complicating feature.   Electronically Signed   By: Van Clines M.D.   On: 04/24/2014 15:45   Dg C-arm 1-60 Min  04/24/2014   CLINICAL DATA:  C6-7 ACDF, intraoperative views.  EXAM: CERVICAL SPINE - 2-3 VIEW; DG C-ARM 61-120 MIN  COMPARISON:  None.  FINDINGS: The first 2  images are from a lateral projection in demonstrated anterior plate and screw fixator with intervertebral prosthesis in place at the C6-7 levels, without complicating feature or visible malalignment. C7 vertebral level is relatively indistinct on these views due to the shoulders.  On the frontal radiograph, the plate and screw fixator are midline.  IMPRESSION: 1. C6-7 ACDF without imaging evidence of complicating feature.   Electronically Signed   By: Van Clines M.D.   On: 04/24/2014 15:45          Follow-up Information    Follow up with Dahlia Bailiff, MD. Schedule an appointment as soon as possible for a visit in 2 weeks.   Specialty:  Orthopedic Surgery   Why:  For suture removal, For wound re-check   Contact information:   [address] [place] [address] [phone]       Discharge Plan:  discharge to home  Disposition: hospital course uneventful.  Neuro intact, ambulating , no significant pain    Signed: Melina Schools D for Dr. Melina Schools [facility] [phone] 04/25/2014, 11:58 AM

## 2014-06-11 ENCOUNTER — Ambulatory Visit: Payer: 59 | Admitting: Physical Therapy

## 2014-06-13 ENCOUNTER — Ambulatory Visit: Payer: 59 | Admitting: Physical Therapy

## 2014-06-28 ENCOUNTER — Ambulatory Visit: Payer: 59 | Attending: Orthopedic Surgery | Admitting: Physical Therapy

## 2014-06-28 DIAGNOSIS — M542 Cervicalgia: Secondary | ICD-10-CM | POA: Insufficient documentation

## 2014-06-28 DIAGNOSIS — M436 Torticollis: Secondary | ICD-10-CM | POA: Insufficient documentation

## 2014-06-28 NOTE — Therapy (Signed)
[facility] [place], [place], [address] Phone: [phone]   Fax:  [phone]  Physical Therapy Evaluation  Patient Details  Name: Russell Carney MRN: [id number] Date of Birth: November 19, 1955 Referring Provider:  Melina Schools, MD  Encounter Date: 06/28/2014      PT End of Session - 06/28/14 1103    Visit Number 1   Number of Visits 12   Date for PT Re-Evaluation 08/16/14   PT Start Time 1034   PT Stop Time 1101   PT Time Calculation (min) 27 min   Activity Tolerance Patient tolerated treatment well      Past Medical History  Diagnosis Date  . Hypertension   . Arthritis     in his knees  . 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 Sleep apnea     been tested last yr.  "moderate"  wears the mask    Past Surgical History  Procedure Laterality Date  . Knee arthroscopy      x 3  . Vasectomy    . Colonoscopy      x 4  . Tonsillectomy    . Eye surgery      lasik back in 2014  . Cervical discectomy  04/24/2014    C6 C7  . Anterior cervical decomp/discectomy fusion N/A 04/24/2014    Procedure: ANTERIOR CERVICAL DISCECTOMY FUSION C6 - C7 1 LEVEL;  Surgeon: Melina Schools, MD;  Location: [facility];  Service: Orthopedics;  Laterality: N/A;    There were no vitals filed for this visit.  Visit Diagnosis:  Neck pain - Plan: PT plan of care cert/re-cert  Stiff neck - Plan: PT plan of care cert/re-cert      Subjective Assessment - 06/28/14 1101    Subjective Right arm feels much better since having surgery.   Limitations Sitting   How long can you sit comfortably? 20 minutes.   Patient Stated Goals Return to work and golf.            [facility] PT Assessment - 06/28/14 0001    Assessment   Medical Diagnosis Cervical fusion.   Onset Date/Surgical Date 04/24/14   Hand Dominance Right   Precautions   Precautions Cervical   Precaution Comments Please follow cervical fusion protocol.   Restrictions   Weight  Bearing Restrictions No   Balance Screen   Has the patient fallen in the past 6 months No   Has the patient had a decrease in activity level because of a fear of falling?  No   Is the patient reluctant to leave their home because of a fear of falling?  No   Home Ecologist residence   Prior Function   Level of Independence Independent   Cognition   Overall Cognitive Status Within Functional Limits for tasks assessed   Posture/Postural Control   Posture/Postural Control No significant limitations   ROM / Strength   AROM / PROM / Strength AROM;Strength   AROM   Overall AROM Comments Active right cervical rotation= 70 degrees and left= 65 degrees.   Palpation   Palpation comment Mild palpable tenderness around the patient's C7 spinous process.   Special Tests    Special Tests --  1+/4+ right Tricep DTR.  PT Long Term Goals - 06/28/14 1138    PT LONG TERM GOAL #1   Title Ind with HEP.   Time 4   Period Weeks   Status New   PT LONG TERM GOAL #2   Title Left active cervical rotation= 70 degrees.   Time 4   Period Weeks   Status New   PT LONG TERM GOAL #3   Title Perform ADL's with pain not > 2-3/10.   Time 4   Period Weeks   Status New   PT LONG TERM GOAL #4   Title Return to golf.   Time 4   Period Weeks   Status New               Plan - 06/28/14 1103    Clinical Impression Statement The patient underwent a cervical fusion surgery on 04/24/14.  He is very pleased with his progress and reports a dramatic decrease in his right UE pain that he was experiencing before surgery.  His resting pain-level is a 2/10 but can rise to 3-4/10 if sieated and flexing his head forward.  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     Pt will benefit from skilled therapeutic intervention in order to improve on the following deficits Pain;Decreased activity tolerance   Rehab Potential Excellent   PT  Frequency 2x / week   PT Duration 4 weeks   PT Treatment/Interventions ADLs/Self Care Home Management;Electrical Stimulation;Moist Heat;Ultrasound;Therapeutic activities;Therapeutic exercise;Patient/family education;Manual techniques   PT Next Visit Plan Please follow cervical fusion protocol.   Consulted and Agree with Plan of Care Patient         Problem List Patient Active Problem List   Diagnosis Date Noted  . Neck pain 04/24/2014    [place], [place] MPT 06/28/2014, 11:43 AM  [facility] [address] [place], [place], [address] Phone: [phone]   Fax:  [phone]

## 2014-07-01 ENCOUNTER — Ambulatory Visit: Payer: 59 | Admitting: Physical Therapy

## 2014-07-01 ENCOUNTER — Encounter: Payer: Self-pay | Admitting: Physical Therapy

## 2014-07-01 DIAGNOSIS — M436 Torticollis: Secondary | ICD-10-CM

## 2014-07-01 DIAGNOSIS — M542 Cervicalgia: Secondary | ICD-10-CM

## 2014-07-01 NOTE — Therapy (Addendum)
[facility] [place], [place], [address] Phone: [phone]   Fax:  [phone]  Physical Therapy Treatment  Patient Details  Name: Russell Carney MRN: [id number] Date of Birth: [birth year] Referring Provider:  [name], MD  Encounter Date: 07/01/2014      PT End of Session - 07/01/14 1032    Visit Number 2   Number of Visits 12   Date for PT Re-Evaluation 08/16/14   PT Start Time 1031   PT Stop Time 1116   PT Time Calculation (min) 45 min   Activity Tolerance Patient tolerated treatment well   Behavior During Therapy [facility] for tasks assessed/performed      Past Medical History  Diagnosis Date  . Hypertension   . Arthritis     in his knees  . 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 Sleep apnea     been tested last yr.  "moderate"  wears the mask    Past Surgical History  Procedure Laterality Date  . Knee arthroscopy      x 3  . Vasectomy    . Colonoscopy      x 4  . Tonsillectomy    . Eye surgery      lasik back in 2014  . Cervical discectomy  04/24/2014    C6 C7  . Anterior cervical decomp/discectomy fusion N/A 04/24/2014    Procedure: ANTERIOR CERVICAL DISCECTOMY FUSION C6 - C7 1 LEVEL;  Surgeon: Melina Schools, MD;  Location: [facility];  Service: Orthopedics;  Laterality: N/A;    There were no vitals filed for this visit.  Visit Diagnosis:  Neck pain  Stiff neck      Subjective Assessment - 07/01/14 1032    Subjective States he doesn't have much pain, only soreness and tightness.   Limitations Sitting   How long can you sit comfortably? 20 minutes.   Patient Stated Goals Return to work and golf.   Currently in Pain? No/denies            [facility] PT Assessment - 07/01/14 0001    Assessment   Medical Diagnosis Cervical fusion.   Onset Date/Surgical Date 04/24/14   Hand Dominance Right   Next MD Visit 07/16/2014                     [facility] Adult PT Treatment/Exercise -  07/01/14 0001    Exercises   Exercises Neck;Shoulder   Shoulder Exercises: Standing   External Rotation Strengthening;Right;Theraband;Other (comment)  x3 sets 10 reps   Theraband Level (Shoulder External Rotation) Level 1 (Yellow)   Internal Rotation Strengthening;Right;Theraband;Other (comment)  x3 sets 10 reps   Theraband Level (Shoulder Internal Rotation) Level 1 (Yellow)   Extension Strengthening;Right;Theraband;Other (comment)  x 3 sets 10 reps   Theraband Level (Shoulder Extension) Level 1 (Yellow)   Row Strengthening;Right;Theraband;Other (comment)  x3 sets 10 reps   Theraband Level (Shoulder Row) Level 1 (Yellow)   Other Standing Exercises Standing R bicep curl yellow theraband x3 sets 10 reps   Other Standing Exercises Standing R tricep extension yellow theraband x3 sets 10 reps      Manual therapy: STW/TPR to B [place] in sitting to decrease pain and tightness.          PT Education - 07/01/14 1115    Education provided Yes   Education Details HEP- ER, IR, rows, extension, bicep curl, tricep extension; yellow theraband given  Person(s) Educated Patient   Methods Explanation;Demonstration;Verbal cues;Handout   Comprehension Verbalized understanding;Returned demonstration             PT Long Term Goals - 07/01/14 1036    PT LONG TERM GOAL #1   Title Ind with HEP.   Time 4   Period Weeks   Status On-going   PT LONG TERM GOAL #2   Title Left active cervical rotation= 70 degrees.   Time 4   Period Weeks   Status On-going   PT LONG TERM GOAL #3   Title Perform ADL's with pain not > 2-3/10.   Time 4   Period Weeks   Status Achieved   PT LONG TERM GOAL #4   Title Return to golf.   Time 4   Period Weeks   Status On-going               Plan - 07/01/14 1119    Clinical Impression Statement Patient tolerated treatment very well today without complaint of pain during the session. Demonstrated good techinque for the strengthening exercises.  Welcomed new strengthening HEP well without questions. Minimal to moderate tightness noted in B UT during manual therapy with minimal to moderate pressure tolerated. Achieved ADLs goals today. All other goals on-going due to decreased cervical rotation and inability to participate in golf activities at this time. Experienced decreased tightness and 2/10 pain following treatment.   Pt will benefit from skilled therapeutic intervention in order to improve on the following deficits Pain;Decreased activity tolerance   Rehab Potential Excellent   PT Frequency 2x / week   PT Duration 4 weeks   PT Treatment/Interventions ADLs/Self Care Home Management;Electrical Stimulation;Moist Heat;Ultrasound;Therapeutic activities;Therapeutic exercise;Patient/family education;Manual techniques   PT Next Visit Plan Continue per cervical fusion protocol.   Consulted and Agree with Plan of Care Patient        Problem List Patient Active Problem List   Diagnosis Date Noted  . Neck pain 04/24/2014    Wynelle Fanny, PTA 07/01/2014, 11:31 AM  [facility] [address] [place], [place], [address] Phone: [phone]   Fax:  [phone]

## 2014-07-01 NOTE — Patient Instructions (Addendum)
Strengthening: Resisted External Rotation   Hold tubing in right hand, elbow at side and forearm across body. Rotate forearm out. Repeat __10__ times per set. Do __3__ sets per session. Do __2__ sessions per day.  http://orth.exer.us/828   Copyright  VHI. All rights reserved.  Strengthening: Resisted Internal Rotation   Hold tubing in left hand, elbow at side and forearm out. Rotate forearm in across body. Repeat _10___ times per set. Do __3__ sets per session. Do _2___ sessions per day.  http://orth.exer.us/830   Copyright  VHI. All rights reserved.  Strengthening: Resisted Extension   Hold tubing in right hand, arm forward. Pull arm back, elbow straight. Repeat __10__ times per set. Do _3___ sets per session. Do __2__ sessions per day.  http://orth.exer.us/832   Copyright  VHI. All rights reserved.  Elbow Flexion: Resisted   With tubing wrapped around left fist and other end secured under foot, curl arm up as far as possible. Repeat _10___ times per set. Do __3__ sets per session. Do __2__ sessions per day.  Copyright  VHI. All rights reserved.  Elbow Extension: Resisted  Elbow Extension: Resisted   Sit in chair with resistive band secured at armrest and right elbow bent. Straighten elbow. Repeat _10___ times per set. Do __3__ sets per session. Do _2___ sessions per day.  Copyright  VHI. All rights reserved.

## 2014-07-04 ENCOUNTER — Ambulatory Visit: Payer: 59 | Admitting: *Deleted

## 2014-07-04 ENCOUNTER — Encounter: Payer: Self-pay | Admitting: *Deleted

## 2014-07-04 DIAGNOSIS — M542 Cervicalgia: Secondary | ICD-10-CM | POA: Diagnosis not present

## 2014-07-04 NOTE — Therapy (Signed)
[facility] [place], [place], [address] Phone: [phone]   Fax:  [phone]  Patient Details  Name: Russell Carney MRN: [id number] Date of Birth: Sep 13, 1955 Referring Provider:  [name], MD  Encounter Date: 07/04/2014   Fabienne Bruns P,PTA 07/04/2014, 12:43 PM  [facility] [address] [place], [place], [address] Phone: [phone]   Fax:  [phone]

## 2014-07-08 ENCOUNTER — Encounter: Payer: Self-pay | Admitting: Physical Therapy

## 2014-07-08 ENCOUNTER — Ambulatory Visit: Payer: 59 | Admitting: Physical Therapy

## 2014-07-08 DIAGNOSIS — M542 Cervicalgia: Secondary | ICD-10-CM | POA: Diagnosis not present

## 2014-07-08 DIAGNOSIS — M436 Torticollis: Secondary | ICD-10-CM

## 2014-07-08 NOTE — Therapy (Signed)
[facility] [place], [place], [address] Phone: [phone]   Fax:  [phone]  Physical Therapy Treatment  Patient Details  Name: Russell Carney MRN: [id number] Date of Birth: Mar 06, 1955 Referring Provider:  [name], MD  Encounter Date: 07/08/2014      PT End of Session - 07/08/14 1034    Visit Number 4   Number of Visits 12   Date for PT Re-Evaluation 08/16/14   PT Start Time 1031   PT Stop Time 1114   PT Time Calculation (min) 43 min   Behavior During Therapy [facility] for tasks assessed/performed      Past Medical History  Diagnosis Date  . Hypertension   . Arthritis     in his knees  . 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 Sleep apnea     been tested last yr.  "moderate"  wears the mask    Past Surgical History  Procedure Laterality Date  . Knee arthroscopy      x 3  . Vasectomy    . Colonoscopy      x 4  . Tonsillectomy    . Eye surgery      lasik back in 2014  . Cervical discectomy  04/24/2014    C6 C7  . Anterior cervical decomp/discectomy fusion N/A 04/24/2014    Procedure: ANTERIOR CERVICAL DISCECTOMY FUSION C6 - C7 1 LEVEL;  Surgeon: Melina Schools, MD;  Location: [place];  Service: Orthopedics;  Laterality: N/A;    There were no vitals filed for this visit.  Visit Diagnosis:  Neck pain  Stiff neck      Subjective Assessment - 07/08/14 1032    Subjective States that he cannot tell a difference in his presentation today. Has a low grade pain around the surgery site at C6-C7. Pain is enough to know it is there. No longer has the pain in the R shoulder blade.   Limitations Sitting   How long can you sit comfortably? 20 minutes.   Patient Stated Goals Return to work and golf.   Currently in Pain? Yes   Pain Score 2    Pain Location Neck   Pain Orientation Posterior            [facility] PT Assessment - 07/08/14 0001    Assessment   Medical Diagnosis Cervical fusion.   Onset  Date/Surgical Date 04/24/14   Next MD Visit 07/16/2014                     [facility] Adult PT Treatment/Exercise - 07/08/14 0001    Shoulder Exercises: Standing   External Rotation Strengthening;Right;Theraband;Other (comment)  x30 reps   Theraband Level (Shoulder External Rotation) Level 1 (Yellow)   Internal Rotation Strengthening;Right;Theraband;Other (comment)  x30 reps   Theraband Level (Shoulder Internal Rotation) Level 1 (Yellow)   Extension Strengthening;Right;Theraband;Other (comment)  x30 reps   Theraband Level (Shoulder Extension) Level 1 (Yellow)   Row Strengthening;Right;Theraband;Other (comment)  x30 reps   Theraband Level (Shoulder Row) Level 1 (Yellow)   Other Standing Exercises Standing R bicep curl yellow theraband x3 sets 10 reps   Other Standing Exercises Standing R tricep extension yellow theraband x3 sets 10 reps   Shoulder Exercises: ROM/Strengthening   UBE (Upper Arm Bike) 90 RPM x 8 min  with focus on posture   Modalities   Modalities Ultrasound   Ultrasound   Ultrasound Location R of C7  and upper thoracic musculature   Ultrasound Parameters 1.5 w/cm2, 100 %, 1 mhz   Ultrasound Goals Pain   Manual Therapy   Manual Therapy Myofascial release   Myofascial Release STW/TPR to R UT into upper thoracic musculature to decrease tightness and pain                     PT Long Term Goals - 07/01/14 1036    PT LONG TERM GOAL #1   Title Ind with HEP.   Time 4   Period Weeks   Status On-going   PT LONG TERM GOAL #2   Title Left active cervical rotation= 70 degrees.   Time 4   Period Weeks   Status On-going   PT LONG TERM GOAL #3   Title Perform ADL's with pain not > 2-3/10.   Time 4   Period Weeks   Status Achieved   PT LONG TERM GOAL #4   Title Return to golf.   Time 4   Period Weeks   Status On-going               Plan - 07/08/14 1119    Clinical Impression Statement Patient tolerated treatment well today without  complaint of increased pain during exercises. Ultrasound and manual therapy completed today secondary to pain around the surgery site. MInimal tightness noted in the R scapular retractors and UT and tolerated minimal to moderate pressure. Normal response to modalites following removal of the modalities. Experienced decreased tightness per patient report upon end of treatment.   Pt will benefit from skilled therapeutic intervention in order to improve on the following deficits Pain;Decreased activity tolerance   Rehab Potential Excellent   PT Frequency 2x / week   PT Duration 4 weeks   PT Treatment/Interventions Electrical Stimulation;Therapeutic exercise;Moist Heat;Manual techniques;Ultrasound   PT Next Visit Plan Continue per cervical fusion protocol. Continue [place] and manual therapy as symptoms dictate.   Consulted and Agree with Plan of Care Patient        Problem List Patient Active Problem List   Diagnosis Date Noted  . Neck pain 04/24/2014    Wynelle Fanny, PTA 07/08/2014, 11:26 AM  [facility] [address] [place], [place], [address] Phone: [phone]   Fax:  [phone]

## 2014-07-11 ENCOUNTER — Ambulatory Visit: Payer: 59 | Admitting: Physical Therapy

## 2014-07-11 ENCOUNTER — Encounter: Payer: Self-pay | Admitting: Physical Therapy

## 2014-07-11 DIAGNOSIS — M542 Cervicalgia: Secondary | ICD-10-CM

## 2014-07-11 DIAGNOSIS — M436 Torticollis: Secondary | ICD-10-CM

## 2014-07-11 NOTE — Therapy (Signed)
[facility] [place], [place], [address] Phone: [phone]   Fax:  [phone]  Physical Therapy Treatment  Patient Details  Name: Russell Carney MRN: [id number] Date of Birth: January 28, 1955 Referring Provider:  [name], MD  Encounter Date: 07/11/2014      PT End of Session - 07/11/14 1036    Visit Number 5   Number of Visits 12   Date for PT Re-Evaluation 08/16/14   PT Start Time 1034   PT Stop Time 1115   PT Time Calculation (min) 41 min   Activity Tolerance Patient tolerated treatment well   Behavior During Therapy [facility] for tasks assessed/performed      Past Medical History  Diagnosis Date  . Hypertension   . Arthritis     in his knees  . 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 Sleep apnea     been tested last yr.  "moderate"  wears the mask    Past Surgical History  Procedure Laterality Date  . Knee arthroscopy      x 3  . Vasectomy    . Colonoscopy      x 4  . Tonsillectomy    . Eye surgery      lasik back in 2014  . Cervical discectomy  04/24/2014    C6 C7  . Anterior cervical decomp/discectomy fusion N/A 04/24/2014    Procedure: ANTERIOR CERVICAL DISCECTOMY FUSION C6 - C7 1 LEVEL;  Surgeon: Melina Schools, MD;  Location: [place];  Service: Orthopedics;  Laterality: N/A;    There were no vitals filed for this visit.  Visit Diagnosis:  Neck pain  Stiff neck      Subjective Assessment - 07/11/14 1035    Subjective Reports very little pain today with a little tightness.   Limitations Sitting   How long can you sit comfortably? 20 minutes.   Patient Stated Goals Return to work and golf.   Currently in Pain? Yes   Pain Score 1    Pain Location Neck   Pain Orientation Right   Pain Descriptors / Indicators Tightness            [facility] PT Assessment - 07/11/14 0001    Assessment   Medical Diagnosis Cervical fusion.   Onset Date/Surgical Date 04/24/14   Next MD Visit 07/16/2014                     [facility] Adult PT Treatment/Exercise - 07/11/14 0001    Neck Exercises: Machines for Strengthening   UBE (Upper Arm Bike) 30 RPM x6 min   Neck Exercises: Theraband   Scapula Retraction Other (comment)  Row at 90 deg Pink XTS x30 reps BUE   Shoulder Extension Other (comment)  Yellow theraband x30 reps RUE   Rows Other (comment)  Yellow theraband x30 reps RUE   Shoulder External Rotation Other (comment)  yellow theraband x30 reps RUE   Shoulder Internal Rotation Other (comment)  Yellow theraband x30 reps RUE   Neck Exercises: Standing   Other Standing Exercises Standing BUE bicep curl Pink XTS x30 reps  with verbal cueing for stable cervical posture   Other Standing Exercises Standing BUE tricep extension Pink XTS x30 reps  Verbal cueing for stable cervical posture   Shoulder Exercises: ROM/Strengthening   Wall Pushups 20 reps   Modalities   Modalities Ultrasound   Ultrasound   Ultrasound Location R of C7  Ultrasound Parameters 1.5 w/cm2, 100%, 61mhz x8 min   Ultrasound Goals Pain   Manual Therapy   Manual Therapy Myofascial release   Myofascial Release STW/TPR to R UT into upper thoracic musculature to decrease tightness and pain                     PT Long Term Goals - 07/01/14 1036    PT LONG TERM GOAL #1   Title Ind with HEP.   Time 4   Period Weeks   Status On-going   PT LONG TERM GOAL #2   Title Left active cervical rotation= 70 degrees.   Time 4   Period Weeks   Status On-going   PT LONG TERM GOAL #3   Title Perform ADL's with pain not > 2-3/10.   Time 4   Period Weeks   Status Achieved   PT LONG TERM GOAL #4   Title Return to golf.   Time 4   Period Weeks   Status On-going               Plan - 07/11/14 1122    Clinical Impression Statement Patient tolerated treatment well today even with new advanced exercises without complaint of increased pain during exercises. Normal modalities response noted  following removal of the modalities. Minimal tightness noted in the R scapular retractors and R Upper Trap today with moderate pressure tolerated by patient. Experienced no tightness following treatment.   Pt will benefit from skilled therapeutic intervention in order to improve on the following deficits Pain;Decreased activity tolerance   Rehab Potential Excellent   PT Frequency 2x / week   PT Duration 4 weeks   PT Treatment/Interventions Electrical Stimulation;Therapeutic exercise;Moist Heat;Manual techniques;Ultrasound   PT Next Visit Plan Continue per cervical fusion protocol. Continue [place] and manual therapy as symptoms dictate. Appt with Dr. Rolena Infante 07/16/2014.   Consulted and Agree with Plan of Care Patient        Problem List Patient Active Problem List   Diagnosis Date Noted  . Neck pain 04/24/2014    Wynelle Fanny, PTA 07/11/2014, 11:26 AM  [facility] [address] [place], [place], [address] Phone: [phone]   Fax:  [phone]

## 2014-07-15 ENCOUNTER — Ambulatory Visit: Payer: 59 | Admitting: Physical Therapy

## 2014-07-15 ENCOUNTER — Encounter: Payer: Self-pay | Admitting: Physical Therapy

## 2014-07-15 DIAGNOSIS — M542 Cervicalgia: Secondary | ICD-10-CM

## 2014-07-15 DIAGNOSIS — M436 Torticollis: Secondary | ICD-10-CM

## 2014-07-15 NOTE — Therapy (Signed)
[facility] [place], [place], [address] Phone: [phone]   Fax:  [phone]  Physical Therapy Treatment  Patient Details  Name: Russell Carney MRN: [id number] Date of Birth: 1955/12/17 Referring Provider:  [name], MD  Encounter Date: 07/15/2014      PT End of Session - 07/15/14 1015    Visit Number 6   Number of Visits 12   Date for PT Re-Evaluation 08/16/14   PT Start Time 0946   PT Stop Time 1030   PT Time Calculation (min) 44 min   Activity Tolerance Patient tolerated treatment well   Behavior During Therapy [facility] for tasks assessed/performed      Past Medical History  Diagnosis Date  . Hypertension   . Arthritis     in his knees  . 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 Sleep apnea     been tested last yr.  "moderate"  wears the mask    Past Surgical History  Procedure Laterality Date  . Knee arthroscopy      x 3  . Vasectomy    . Colonoscopy      x 4  . Tonsillectomy    . Eye surgery      lasik back in 2014  . Cervical discectomy  04/24/2014    C6 C7  . Anterior cervical decomp/discectomy fusion N/A 04/24/2014    Procedure: ANTERIOR CERVICAL DISCECTOMY FUSION C6 - C7 1 LEVEL;  Surgeon: Melina Schools, MD;  Location: [place];  Service: Orthopedics;  Laterality: N/A;    There were no vitals filed for this visit.  Visit Diagnosis:  Neck pain  Stiff neck      Subjective Assessment - 07/15/14 0957    Subjective Patient has reported good progress thus far with therapy and only sharp pain with certain movements   Limitations Sitting   How long can you sit comfortably? 20 minutes.   Patient Stated Goals Return to work and golf.   Currently in Pain? Yes   Pain Score 1    Pain Location Neck   Pain Orientation Right   Pain Descriptors / Indicators Tightness   Pain Type Surgical pain   Pain Onset More than a month ago                         [facility] Adult PT  Treatment/Exercise - 07/15/14 0001    Neck Exercises: Machines for Strengthening   UBE (Upper Arm Bike) 30 RPM x8 min   Neck Exercises: Theraband   Other Theraband Exercises Rw4 with yellow tband 3x10 each   Other Theraband Exercises scap retractions with PINK XTS 3x10   Neck Exercises: Seated   Other Seated Exercise chin tucks 2x10   Other Seated Exercise scap retractions 2x10, "V" 2x10   Ultrasound   Ultrasound Location right c-spine paraspinals   Ultrasound Parameters 1.5w/cm2/50%/12mz x152m   Ultrasound Goals Pain                     PT Long Term Goals - 07/15/14 1030    PT LONG TERM GOAL #1   Title Ind with HEP.   Time 4   Period Weeks   Status On-going   PT LONG TERM GOAL #2   Title Left active cervical rotation= 70 degrees.   Time 4   Period Weeks   Status Achieved  70 degrees  PT LONG TERM GOAL #3   Title Perform ADL's with pain not > 2-3/10.   Time 4   Period Weeks   Status Achieved   PT LONG TERM GOAL #4   Title Return to golf.   Time 4   Period Weeks   Status On-going               Plan - 07/15/14 1026    Clinical Impression Statement Patient progressing with all activities. Patient reports little to no pain overall. Has reported sharp pain with certain movements of his neck into flex or ext, advised patient to avoid any painful motions at this time. Patient has met cervical rotation AROM goal today. Other goals ongoing due to not returned to golf per MD orders.    Pt will benefit from skilled therapeutic intervention in order to improve on the following deficits Pain;Decreased activity tolerance   Rehab Potential Excellent   Clinical Impairments Affecting Rehab Potential surgery April 24, 2014 - current 12 weeks 07/17/14   PT Frequency 2x / week   PT Duration 4 weeks   PT Treatment/Interventions Electrical Stimulation;Therapeutic exercise;Moist Heat;Manual techniques;Ultrasound   PT Next Visit Plan Continue per cervical fusion protocol.  Continue [place] and manual therapy as symptoms dictate. Appt with Dr. Rolena Infante 07/16/2014.   Consulted and Agree with Plan of Care Patient        Problem List Patient Active Problem List   Diagnosis Date Noted  . Neck pain 04/24/2014   Ladean Raya, PTA 07/15/2014 10:38 AM Eleonora Peeler, Venetia Maxon, PTA 07/15/2014, 10:38 AM  [facility] [place], [place], [address] Phone: [phone]   Fax:  [phone]     Madelyn Flavors, PT 07/15/2014 3:04 PM [facility] [address] [place], [place], [address] Phone: [phone]   Fax:  [phone]

## 2014-07-18 ENCOUNTER — Ambulatory Visit: Payer: 59 | Admitting: Physical Therapy

## 2014-07-18 DIAGNOSIS — M436 Torticollis: Secondary | ICD-10-CM

## 2014-07-18 DIAGNOSIS — M542 Cervicalgia: Secondary | ICD-10-CM

## 2014-07-18 NOTE — Therapy (Signed)
[facility] [place], [place], [address] Phone: [phone]   Fax:  [phone]  Physical Therapy Treatment  Patient Details  Name: Russell Carney MRN: [id number] Date of Birth: Dec 13, 1955 Referring Provider:  [name], MD  Encounter Date: 07/18/2014      PT End of Session - 07/18/14 0902    Visit Number 7   Number of Visits 12   Date for PT Re-Evaluation 08/16/14   PT Start Time 0900   PT Stop Time 0941   PT Time Calculation (min) 41 min   Activity Tolerance Patient tolerated treatment well      Past Medical History  Diagnosis Date  . Hypertension   . Arthritis     in his knees  . 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 Sleep apnea     been tested last yr.  "moderate"  wears the mask    Past Surgical History  Procedure Laterality Date  . Knee arthroscopy      x 3  . Vasectomy    . Colonoscopy      x 4  . Tonsillectomy    . Eye surgery      lasik back in 2014  . Cervical discectomy  04/24/2014    C6 C7  . Anterior cervical decomp/discectomy fusion N/A 04/24/2014    Procedure: ANTERIOR CERVICAL DISCECTOMY FUSION C6 - C7 1 LEVEL;  Surgeon: Melina Schools, MD;  Location: [place];  Service: Orthopedics;  Laterality: N/A;    There were no vitals filed for this visit.  Visit Diagnosis:  Neck pain  Stiff neck      Subjective Assessment - 07/18/14 0904    Subjective Patient reports MD told him he could start hitting golf balls and mowing now. He reports MD said neck looks good. Patient plans to come 3 more visits and then DC to HEP.   Currently in Pain? Yes   Pain Score 1    Aggravating Factors  certain movements cause sharp pain but goes right away   Pain Relieving Factors none needed   Effect of Pain on Daily Activities just feels it                         [facility] Adult PT Treatment/Exercise - 07/18/14 0001    Neck Exercises: Machines for Strengthening   UBE (Upper Arm  Bike) 30 RPM x8 min   Neck Exercises: Theraband   Scapula Retraction Other (comment)  Row at 90 deg Pink XTS x30 reps BUE; 3 sec hold   Other Theraband Exercises Rw4 with red tband 3x10 each   Neck Exercises: Prone   Other Prone Exercise Ts and Ys 1#  2x10   Modalities   Modalities Ultrasound   Ultrasound   Ultrasound Location R of C7   Ultrasound Parameters 1.5wcm2 1Mhz cont x 10   Ultrasound Goals Pain   Neck Exercises: Stretches   Levator Stretch 1 rep;20 seconds                PT Education - 07/18/14 0943    Education Details Advised levator scap stretch daily. Good return demo by pt.   Person(s) Educated Patient   Methods Explanation;Demonstration   Comprehension Verbalized understanding;Returned demonstration             PT Long Term Goals - 07/15/14 1030    PT LONG TERM GOAL #1  Title Ind with HEP.   Time 4   Period Weeks   Status On-going   PT LONG TERM GOAL #2   Title Left active cervical rotation= 70 degrees.   Time 4   Period Weeks   Status Achieved  70 degrees   PT LONG TERM GOAL #3   Title Perform ADL's with pain not > 2-3/10.   Time 4   Period Weeks   Status Achieved   PT LONG TERM GOAL #4   Title Return to golf.   Time 4   Period Weeks   Status On-going               Plan - 07/18/14 0943    Clinical Impression Statement Patient tolerated increase in resistive shoulder exercises well without any pain. Advised levator scapula stretch daily.   PT Next Visit Plan Continue upper back strenghtening; progress HEP to prepare for D/C week of 07/22/14.   Consulted and Agree with Plan of Care Patient        Problem List Patient Active Problem List   Diagnosis Date Noted  . Neck pain 04/24/2014    Madelyn Flavors PT  07/18/2014, 11:56 AM  [facility] [address] [place], [place], [address] Phone: [phone]   Fax:  [phone]

## 2014-07-23 ENCOUNTER — Encounter: Payer: Self-pay | Admitting: Physical Therapy

## 2014-07-23 ENCOUNTER — Ambulatory Visit: Payer: 59 | Attending: Orthopedic Surgery | Admitting: Physical Therapy

## 2014-07-23 DIAGNOSIS — M436 Torticollis: Secondary | ICD-10-CM | POA: Diagnosis present

## 2014-07-23 DIAGNOSIS — M542 Cervicalgia: Secondary | ICD-10-CM | POA: Diagnosis not present

## 2014-07-23 NOTE — Therapy (Addendum)
[facility] [place], [place], [address] Phone: [phone]   Fax:  [phone]  Physical Therapy Treatment  Patient Details  Name: Russell Carney MRN: [id number] Date of Birth: 01-05-1956 Referring Provider:  [name], MD  Encounter Date: 07/23/2014      PT End of Session - 07/23/14 0913    Visit Number 8   Number of Visits 12   Date for PT Re-Evaluation 08/16/14   PT Start Time 0900   PT Stop Time 0941   PT Time Calculation (min) 41 min   Activity Tolerance Patient tolerated treatment well   Behavior During Therapy [facility] for tasks assessed/performed      Past Medical History  Diagnosis Date  . Hypertension   . Arthritis     in his knees  . 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 Sleep apnea     been tested last yr.  "moderate"  wears the mask    Past Surgical History  Procedure Laterality Date  . Knee arthroscopy      x 3  . Vasectomy    . Colonoscopy      x 4  . Tonsillectomy    . Eye surgery      lasik back in 2014  . Cervical discectomy  04/24/2014    C6 C7  . Anterior cervical decomp/discectomy fusion N/A 04/24/2014    Procedure: ANTERIOR CERVICAL DISCECTOMY FUSION C6 - C7 1 LEVEL;  Surgeon: Melina Schools, MD;  Location: [place];  Service: Orthopedics;  Laterality: N/A;    There were no vitals filed for this visit.  Visit Diagnosis:  Neck pain  Stiff neck      Subjective Assessment - 07/23/14 0904    Subjective Reports some soreness after hitting golf balls on driving range at full swing. Reports feeling improvement though. Has been doubling yellow band for HEP at home.   Limitations Sitting   How long can you sit comfortably? 20 minutes.   Patient Stated Goals Return to work and golf.   Currently in Pain? Yes   Pain Score 2    Pain Location Neck   Pain Orientation Right   Pain Descriptors / Indicators Sore   Pain Type Surgical pain   Pain Onset More than a month ago   Pain  Frequency Intermittent            [facility] PT Assessment - 07/23/14 0001    Assessment   Medical Diagnosis Cervical fusion.   Onset Date/Surgical Date 04/24/14   Next MD Visit 09/2014                     [facility] Adult PT Treatment/Exercise - 07/23/14 0001    Shoulder Exercises: Seated   Horizontal ABduction Strengthening;Right;Theraband;Other (comment)  x30 reps   Theraband Level (Shoulder Horizontal ABduction) Level 2 (Red)   Shoulder Exercises: Prone   Extension Strengthening;Right;Weights;Other (comment)  x30 reps   Extension Weight (lbs) 2   Other Prone Exercises RUE Supraspinatus strengthening 2# x30 reps   Other Prone Exercises RUE scaption 2# x30 reps   Shoulder Exercises: Standing   Protraction Strengthening;Right;Theraband;Other (comment)  x30 reps   Theraband Level (Shoulder Protraction) Level 2 (Red)   External Rotation Strengthening;Right;Theraband;Other (comment)  x30 reps   Theraband Level (Shoulder External Rotation) Level 2 (Red)   Internal Rotation Strengthening;Right;Theraband;Other (comment)  x30 reps   Theraband Level (Shoulder Internal Rotation) Level 2 (Red)  Extension Strengthening;Right;Theraband;Other (comment)  x30 reps   Theraband Level (Shoulder Extension) Level 2 (Red)   Row Strengthening;Right;Theraband;Other (comment)  x30 reps   Theraband Level (Shoulder Row) Level 2 (Red)   Other Standing Exercises RUE shoulder abduction with ER red band x30 reps   Shoulder Exercises: ROM/Strengthening   UBE (Upper Arm Bike) 90 RPM x 8 min   Shoulder Exercises: Stretch   Other Shoulder Stretches R levator scapulae stretch 3 x30 reps   Modalities   Modalities Ultrasound   Ultrasound   Ultrasound Location R of C7   Ultrasound Parameters 1.5 w/cm2, 100%, 63mhz   Ultrasound Goals Pain                PT Education - 07/23/14 0921    Education provided Yes   Education Details HEP- scaption, horizontal abduction, extension,  supraspinatus dumbbell strengthening; given red theraband   Person(s) Educated Patient   Methods Explanation;Demonstration;Verbal cues;Handout   Comprehension Verbalized understanding;Returned demonstration;Verbal cues required             PT Long Term Goals - 07/23/14 0930    PT LONG TERM GOAL #1   Title Ind with HEP.   Time 4   Period Weeks   Status Achieved   PT LONG TERM GOAL #2   Title Left active cervical rotation= 70 degrees.   Time 4   Period Weeks   Status Achieved  70 degrees   PT LONG TERM GOAL #3   Title Perform ADL's with pain not > 2-3/10.   Time 4   Period Weeks   Status Achieved   PT LONG TERM GOAL #4   Title Return to golf.   Time 4   Period Weeks   Status On-going               Plan - 07/23/14 0943    Clinical Impression Statement Patient tolerated treatment well and completed all exericses with good technique following minimal verbal cueing and demonstration for technique. Has achieved all goals set at evaluation except for a full return to golf. Minimal tightness noted in the musculature to the R of C7. Accepted new HEP exercises and red theraband without questions. Normal modalities response noted following removal of the modalities. Experienced "feeling good" following treatment.   Pt will benefit from skilled therapeutic intervention in order to improve on the following deficits Pain;Decreased activity tolerance   Rehab Potential Excellent   Clinical Impairments Affecting Rehab Potential surgery April 24, 2014 - current 12 weeks 07/17/14   PT Frequency 2x / week   PT Duration 4 weeks   PT Treatment/Interventions Electrical Stimulation;Therapeutic exercise;Moist Heat;Manual techniques;Ultrasound   PT Next Visit Plan Continue upper back strenghtening; progress HEP to prepare for D/C this week.   Consulted and Agree with Plan of Care Patient        Problem List Patient Active Problem List   Diagnosis Date Noted  . Neck pain 04/24/2014     KWynelle Fanny PTA 07/23/2014, 9:46 AM  [facility] [address]Phone: [phone]  Fax:  [phone]  PHYSICAL THERAPY DISCHARGE SUMMARY  Visits from Start of Care: 8.  Current functional level related to goals / functional outcomes: Please see above.   Remaining deficits: Goals essentially met.   Education / Equipment: HEP. Plan: Patient agrees to discharge.  Patient goals were met. Patient is being discharged due to meeting the stated rehab goals.  ?????  Chad Applegate MPT  

## 2014-07-23 NOTE — Patient Instructions (Signed)
Progressive Resisted: Extension (Prone)   Holding _2-___ pound weights, arms back, raise arms from floor, keeping elbows straight. Repeat __10__ times per set. Do __3__ sets per session. Do ___2_ sessions per day.  http://orth.exer.us/872   Copyright  VHI. All rights reserved.  Resisted Horizontal Abduction: Bilateral   Sit or stand, tubing in both hands, arms out in front. Keeping arms straight, pinch shoulder blades together and stretch arms out. Repeat __10__ times per set. Do __3__ sets per session. Do __2__ sessions per day.  http://orth.exer.us/968   Copyright  VHI. All rights reserved.  Supraspinatus Strengthening   Holding __2-__ pound weight, raise right arm diagonally from hip to just below shoulder level. Keep elbow straight, thumb down. Repeat __10__ times per set. Do _3___ sets per session. Do __2__ sessions per day.  http://orth.exer.us/890   Copyright  VHI. All rights reserved.  Strengthening: Scaption - with External Rotation   Holding __2__ pound weight, raise right arm diagonally from hip to above head. Keep elbow straight, thumb up. Repeat __10__ times per set. Do _3___ sets per session. Do _2___ sessions per day.  http://orth.exer.us/892   Copyright  VHI. All rights reserved.

## 2014-07-30 ENCOUNTER — Emergency Department (HOSPITAL_COMMUNITY): Payer: 59

## 2014-07-30 ENCOUNTER — Encounter (HOSPITAL_COMMUNITY): Payer: Self-pay | Admitting: Emergency Medicine

## 2014-07-30 ENCOUNTER — Emergency Department (HOSPITAL_COMMUNITY)
Admission: EM | Admit: 2014-07-30 | Discharge: 2014-07-30 | Disposition: A | Discharge status: Home / Self Care | Payer: 59 | Attending: Emergency Medicine | Admitting: Emergency Medicine

## 2014-07-30 DIAGNOSIS — Z8719 Personal history of other diseases of the digestive system: Secondary | ICD-10-CM | POA: Diagnosis not present

## 2014-07-30 DIAGNOSIS — R197 Diarrhea, unspecified: Secondary | ICD-10-CM | POA: Insufficient documentation

## 2014-07-30 DIAGNOSIS — R1084 Generalized abdominal pain: Secondary | ICD-10-CM

## 2014-07-30 DIAGNOSIS — Z8669 Personal history of other diseases of the nervous system and sense organs: Secondary | ICD-10-CM | POA: Diagnosis not present

## 2014-07-30 DIAGNOSIS — Z79899 Other long term (current) drug therapy: Secondary | ICD-10-CM | POA: Diagnosis not present

## 2014-07-30 DIAGNOSIS — M1990 Unspecified osteoarthritis, unspecified site: Secondary | ICD-10-CM | POA: Insufficient documentation

## 2014-07-30 DIAGNOSIS — I10 Essential (primary) hypertension: Secondary | ICD-10-CM | POA: Diagnosis not present

## 2014-07-30 DIAGNOSIS — R109 Unspecified abdominal pain: Secondary | ICD-10-CM | POA: Diagnosis present

## 2014-07-30 DIAGNOSIS — R0682 Tachypnea, not elsewhere classified: Secondary | ICD-10-CM | POA: Insufficient documentation

## 2014-07-30 DIAGNOSIS — R112 Nausea with vomiting, unspecified: Secondary | ICD-10-CM | POA: Insufficient documentation

## 2014-07-30 LAB — COMPREHENSIVE METABOLIC PANEL
ALT: 27 U/L (ref 17–63)
AST: 21 U/L (ref 15–41)
Albumin: 3.9 g/dL (ref 3.5–5.0)
Alkaline Phosphatase: 52 U/L (ref 38–126)
Anion gap: 15 (ref 5–15)
BUN: 21 mg/dL — AB (ref 6–20)
CALCIUM: 8.5 mg/dL — AB (ref 8.9–10.3)
CO2: 22 mmol/L (ref 22–32)
CREATININE: 1.17 mg/dL (ref 0.61–1.24)
Chloride: 100 mmol/L — ABNORMAL LOW (ref 101–111)
GFR calc non Af Amer: >60 mL/min (ref >60)
GLUCOSE: 166 mg/dL — AB (ref 65–99)
Potassium: 3.3 mmol/L — ABNORMAL LOW (ref 3.5–5.1)
SODIUM: 137 mmol/L (ref 135–145)
Total Bilirubin: 0.9 mg/dL (ref 0.3–1.2)
Total Protein: 6.9 g/dL (ref 6.5–8.1)

## 2014-07-30 LAB — CBC WITH DIFFERENTIAL/PLATELET
BASOS ABS: 0.1 10*3/uL (ref 0.0–0.1)
Basophils Relative: 1 % (ref 0–1)
EOS PCT: 0 % (ref 0–5)
Eosinophils Absolute: 0 10*3/uL (ref 0.0–0.7)
HCT: 47.2 % (ref 39.0–52.0)
HEMOGLOBIN: 17.2 g/dL — AB (ref 13.0–17.0)
Lymphocytes Relative: 35 % (ref 12–46)
Lymphs Abs: 3.8 10*3/uL (ref 0.7–4.0)
MCH: 30.8 pg (ref 26.0–34.0)
MCHC: 36.4 g/dL — ABNORMAL HIGH (ref 30.0–36.0)
MCV: 84.6 fL (ref 78.0–100.0)
Monocytes Absolute: 1.3 10*3/uL — ABNORMAL HIGH (ref 0.1–1.0)
Monocytes Relative: 12 % (ref 3–12)
NEUTROS ABS: 5.6 10*3/uL (ref 1.7–7.7)
Neutrophils Relative %: 52 % (ref 43–77)
Platelets: 205 10*3/uL (ref 150–400)
RBC: 5.58 MIL/uL (ref 4.22–5.81)
RDW: 12.6 % (ref 11.5–15.5)
WBC: 10.9 10*3/uL — AB (ref 4.0–10.5)

## 2014-07-30 LAB — URINALYSIS, ROUTINE W REFLEX MICROSCOPIC
Bilirubin Urine: NEGATIVE
GLUCOSE, UA: NEGATIVE mg/dL
KETONES UR: 15 mg/dL — AB
Leukocytes, UA: NEGATIVE
NITRITE: NEGATIVE
PROTEIN: NEGATIVE mg/dL
SPECIFIC GRAVITY, URINE: 1.01 (ref 1.005–1.030)
Urobilinogen, UA: 0.2 mg/dL (ref 0.0–1.0)
pH: 6 (ref 5.0–8.0)

## 2014-07-30 LAB — URINE MICROSCOPIC-ADD ON

## 2014-07-30 LAB — LIPASE, BLOOD: Lipase: 44 U/L (ref 22–51)

## 2014-07-30 MED ORDER — OMEPRAZOLE 20 MG PO CPDR: DELAYED_RELEASE_CAPSULE | ORAL | Status: DC

## 2014-07-30 MED ORDER — HYDROMORPHONE HCL 1 MG/ML IJ SOLN
1.0000 mg | Freq: Once | INTRAMUSCULAR | Status: AC
  Administered 2014-07-30: 1 mg via INTRAVENOUS

## 2014-07-30 MED ORDER — IOHEXOL 300 MG/ML  SOLN
50.0000 mL | Freq: Once | INTRAMUSCULAR | Status: AC | PRN
  Administered 2014-07-30: 50 mL via ORAL

## 2014-07-30 MED ORDER — SIMETHICONE 40 MG/0.6ML PO SUSP
ORAL | Status: AC
  Filled 2014-07-30: qty 0.6

## 2014-07-30 MED ORDER — FENTANYL CITRATE (PF) 100 MCG/2ML IJ SOLN
50.0000 ug | Freq: Once | INTRAMUSCULAR | Status: AC
  Administered 2014-07-30: 50 ug via INTRAVENOUS
  Filled 2014-07-30: qty 2

## 2014-07-30 MED ORDER — DIPHENHYDRAMINE HCL 50 MG/ML IJ SOLN
25.0000 mg | Freq: Once | INTRAMUSCULAR | Status: AC
  Administered 2014-07-30: 25 mg via INTRAVENOUS
  Filled 2014-07-30: qty 1

## 2014-07-30 MED ORDER — OXYCODONE-ACETAMINOPHEN 5-325 MG PO TABS: 1.0000 | ORAL_TABLET | Freq: Four times a day (QID) | ORAL | Status: DC | PRN

## 2014-07-30 MED ORDER — ONDANSETRON HCL 4 MG/2ML IJ SOLN
4.0000 mg | Freq: Once | INTRAMUSCULAR | Status: AC
  Administered 2014-07-30: 4 mg via INTRAVENOUS
  Filled 2014-07-30: qty 2

## 2014-07-30 MED ORDER — METOCLOPRAMIDE HCL 5 MG/ML IJ SOLN
10.0000 mg | Freq: Once | INTRAMUSCULAR | Status: AC
  Administered 2014-07-30: 10 mg via INTRAVENOUS
  Filled 2014-07-30: qty 2

## 2014-07-30 MED ORDER — SIMETHICONE 40 MG/0.6ML PO SUSP
40.0000 mg | Freq: Once | ORAL | Status: AC
  Administered 2014-07-30: 40 mg via ORAL

## 2014-07-30 MED ORDER — SODIUM CHLORIDE 0.9 % IV SOLN
1000.0000 mL | Freq: Once | INTRAVENOUS | Status: AC
  Administered 2014-07-30: 1000 mL via INTRAVENOUS

## 2014-07-30 MED ORDER — IOHEXOL 300 MG/ML  SOLN
100.0000 mL | Freq: Once | INTRAMUSCULAR | Status: AC | PRN
Start: 2014-07-30 — End: 2014-07-30
  Administered 2014-07-30: 100 mL via INTRAVENOUS

## 2014-07-30 MED ORDER — METOCLOPRAMIDE HCL 10 MG PO TABS: 10.0000 mg | ORAL_TABLET | Freq: Three times a day (TID) | ORAL | Status: DC

## 2014-07-30 MED ORDER — SODIUM CHLORIDE 0.9 % IV SOLN
1000.0000 mL | INTRAVENOUS | Status: DC
  Administered 2014-07-30: 1000 mL via INTRAVENOUS

## 2014-07-30 MED ORDER — FENTANYL CITRATE (PF) 100 MCG/2ML IJ SOLN
INTRAMUSCULAR | Status: AC
  Administered 2014-07-30: 50 ug
  Filled 2014-07-30: qty 2

## 2014-07-30 MED ORDER — HYDROMORPHONE HCL 1 MG/ML IJ SOLN
0.5000 mg | Freq: Once | INTRAMUSCULAR | Status: AC
  Administered 2014-07-30: 0.5 mg via INTRAVENOUS
  Filled 2014-07-30: qty 1

## 2014-07-30 MED ORDER — HYDROMORPHONE HCL 1 MG/ML IJ SOLN
INTRAMUSCULAR | Status: AC
  Filled 2014-07-30: qty 1

## 2014-07-30 NOTE — Discharge Instructions (Signed)
Avoid fried, spicy or greasy foods, look at the GERD diet sheet. Try a bland diet for the next 24 hours such as toast, jello, crackers, campbells chicken noodle soup.  Take the prilosec as directed. Take the percocet for pain if needed. You can take simethicone for gas. If you continue to have problems you may need to see a gastroenterologist, you can call Dr Olevia Perches office to get an appointment.     Abdominal Pain Many things can cause abdominal pain. Usually, abdominal pain is not caused by a disease and will improve without treatment. It can often be observed and treated at home. Your health care provider will do a physical exam and possibly order blood tests and X-rays to help determine the seriousness of your pain. However, in many cases, more time must pass before a clear cause of the pain can be found. Before that point, your health care provider may not know if you need more testing or further treatment. HOME CARE INSTRUCTIONS  Monitor your abdominal pain for any changes. The following actions may help to alleviate any discomfort you are experiencing:  Only take over-the-counter or prescription medicines as directed by your health care provider.  Do not take laxatives unless directed to do so by your health care provider.  Try a clear liquid diet (broth, tea, or water) as directed by your health care provider. Slowly move to a bland diet as tolerated. SEEK MEDICAL CARE IF:  You have unexplained abdominal pain.  You have abdominal pain associated with nausea or diarrhea.  You have pain when you urinate or have a bowel movement.  You experience abdominal pain that wakes you in the night.  You have abdominal pain that is worsened or improved by eating food.  You have abdominal pain that is worsened with eating fatty foods.  You have a fever. SEEK IMMEDIATE MEDICAL CARE IF:   Your pain does not go away within 2 hours.  You keep throwing up (vomiting).  Your pain is felt only  in portions of the abdomen, such as the right side or the left lower portion of the abdomen.  You pass bloody or black tarry stools. MAKE SURE YOU:  Understand these instructions.   Will watch your condition.   Will get help right away if you are not doing well or get worse.  Document Released: 10/14/2004 Document Revised: 01/09/2013 Document Reviewed: 09/13/2012 [facility] Patient Information 2015 [place], [place]. This information is not intended to replace advice given to you by your health care provider. Make sure you discuss any questions you have with your health care provider.

## 2014-07-30 NOTE — ED Notes (Signed)
Pt c/o abd pain with diarrhea x 5 days.

## 2014-07-30 NOTE — ED Provider Notes (Signed)
CSN: [id number]   Arrival date & time 07/30/14 0031  History  This chart was scribed for  [name], MD by Altamease Oiler, ED Scribe. This patient was seen in room APA07/APA07 and the patient's care was started at 12:40 AM.  Chief Complaint  Patient presents with  . Abdominal Pain    HPI The history is provided by the patient. No language interpreter was used.   Russell Carney is a 59 y.o. male who presents to the Emergency Department complaining of constant generalized abdominal pain with onset tonight around 8 PM. The pain is described as burning and diffuse and rated 10/10 in severity. No palliating or potentiating factors. Associated symptoms include diarrhea for 5 days up to 15 episodes a day that he states are watery, nausea, and 1 episode of emesis. Pt reports that he has not eaten much for 5 days and didn't eat anything for 3 days. Today he had a [place] sandwich earlier and a greasy "Sloppy Joe" around 6:30 PM just before his pain started. Pt denies dizziness. He denies any prior abdominal surgery. He states he's never had this pain before. He does state he feels bloated.  He is not on any antiinflammatory medication. He denies tobacco use and uses alcohol occasionally. 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   PCP Dr Dorthy Cooler  Past Medical History  Diagnosis Date  . Hypertension   . Arthritis     in his knees  . 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 Sleep apnea     been tested last yr.  "moderate"  wears the mask    Past Surgical History  Procedure Laterality Date  . Knee arthroscopy      x 3  . Vasectomy    . Colonoscopy      x 4  . Tonsillectomy    . Eye surgery      lasik back in 2014  . Cervical discectomy  04/24/2014    C6 C7  . Anterior cervical decomp/discectomy fusion N/A 04/24/2014    Procedure: ANTERIOR CERVICAL DISCECTOMY FUSION C6 - C7 1 LEVEL;  Surgeon: Melina Schools, MD;  Location: [facility];  Service: Orthopedics;   Laterality: N/A;    No family history on file.  History  Substance Use Topics  . Smoking status: Never Smoker   . Smokeless tobacco: Never Used  . Alcohol Use: Yes     Comment: during the hollidays   on medical leave for neck surgery  Review of Systems  Gastrointestinal: Positive for nausea, vomiting, abdominal pain and diarrhea.  Neurological: Negative for dizziness.  All other systems reviewed and are negative.   Home Medications   Prior to Admission medications   Medication Sig Start Date End Date Taking? Authorizing Provider  docusate sodium (COLACE) 100 MG capsule Take 1 capsule (100 mg total) by mouth 3 (three) times daily as needed for mild constipation. Patient not taking: Reported on 06/28/2014 04/24/14   Melina Schools, MD  hydrochlorothiazide (HYDRODIURIL) 25 MG tablet Take 25 mg by mouth daily. 01/25/14   Historical Provider, MD  losartan (COZAAR) 25 MG tablet Take 25 mg by mouth daily. 01/25/14   Historical Provider, MD  methocarbamol (ROBAXIN) 500 MG tablet Take 1 tablet (500 mg total) by mouth 3 (three) times daily as needed for muscle spasms. 04/24/14   Melina Schools, MD  metoCLOPramide (REGLAN) 10 MG tablet Take  1 tablet (10 mg total) by mouth 4 (four) times daily -  before meals and at bedtime. 07/30/14   [name], MD  omeprazole (PRILOSEC) 20 MG capsule Take 1 po BID x 2 weeks then once a day 07/30/14   [name], MD  ondansetron (ZOFRAN) 4 MG tablet Take 1 tablet (4 mg total) by mouth every 8 (eight) hours as needed for nausea or vomiting. 04/24/14   Melina Schools, MD  oxyCODONE-acetaminophen (PERCOCET/ROXICET) 5-325 MG per tablet Take 1 tablet by mouth every 6 (six) hours as needed for severe pain. 07/30/14   [name], MD  simvastatin (ZOCOR) 20 MG tablet Take 20 mg by mouth daily.    Historical Provider, MD    Allergies  Lisinopril  Triage Vitals: BP 133/75 mmHg  Pulse 57  Resp 24  Wt 220 lb (99.791 kg)  SpO2 100%  Vital signs normal except for  bradycardia   Physical Exam  Constitutional: He is oriented to person, place, and time. He appears well-developed and well-nourished.  Non-toxic appearance. He does not appear ill. He appears distressed.  Pt moaning in pain  HENT:  Head: Normocephalic and atraumatic.  Right Ear: External ear normal.  Left Ear: External ear normal.  Nose: Nose normal. No mucosal edema or rhinorrhea.  Mouth/Throat: Oropharynx is clear and moist. Mucous membranes are dry. No dental abscesses or uvula swelling.  Eyes: Conjunctivae and EOM are normal. Pupils are equal, round, and reactive to light.  Neck: Normal range of motion and full passive range of motion without pain. Neck supple.  Cardiovascular: Normal rate, regular rhythm and normal heart sounds.  Exam reveals no gallop and no friction rub.   No murmur heard. Pulmonary/Chest: Effort normal and breath sounds normal. Tachypnea noted. No respiratory distress. He has no wheezes. He has no rhonchi. He has no rales. He exhibits no tenderness and no crepitus.  Abdominal: Soft. Normal appearance and bowel sounds are normal. He exhibits no distension. There is generalized tenderness. There is no rebound and no guarding.    Diffuse abdominal tenderness without localization  Musculoskeletal: Normal range of motion. He exhibits no edema or tenderness.  Moves all extremities well.   Neurological: He is alert and oriented to person, place, and time. He has normal strength. No cranial nerve deficit.  Skin: Skin is warm, dry and intact. No rash noted. No erythema. No pallor.  Psychiatric: His mood appears anxious. His speech is rapid and/or pressured. He is agitated.  Nursing note and vitals reviewed.   ED Course  Procedures  Medications  0.9 %  sodium chloride infusion (0 mLs Intravenous Stopped 07/30/14 0431)    Followed by  0.9 %  sodium chloride infusion (0 mLs Intravenous Stopped 07/30/14 0431)    Followed by  0.9 %  sodium chloride infusion (1,000 mLs  Intravenous New Bag/Given 07/30/14 0319)  simethicone (MYLICON) 40 ZH/2.9JM suspension 40 mg (not administered)  fentaNYL (SUBLIMAZE) injection 50 mcg (50 mcg Intravenous Given 07/30/14 0056)  ondansetron (ZOFRAN) injection 4 mg (4 mg Intravenous Given 07/30/14 0056)  HYDROmorphone (DILAUDID) injection 1 mg (1 mg Intravenous Given 07/30/14 0118)  iohexol (OMNIPAQUE) 300 MG/ML solution 50 mL (50 mLs Oral Contrast Given 07/30/14 0151)  iohexol (OMNIPAQUE) 300 MG/ML solution 100 mL (100 mLs Intravenous Contrast Given 07/30/14 0250)  HYDROmorphone (DILAUDID) injection 0.5 mg (0.5 mg Intravenous Given 07/30/14 0318)  fentaNYL (SUBLIMAZE) 100 MCG/2ML injection (50 mcg  Given 07/30/14 0449)  metoCLOPramide (REGLAN) injection 10 mg (10 mg Intravenous Given 07/30/14  5093)  diphenhydrAMINE (BENADRYL) injection 25 mg (25 mg Intravenous Given 07/30/14 0534)      DIAGNOSTIC STUDIES: Oxygen Saturation is 100% on RA, normal by my interpretation.    COORDINATION OF CARE: 12:46 AM Discussed treatment plan which includes abdomen and chest XR, lab work, fentanyl, Zofran, and IVF with pt at bedside and pt agreed to plan.  01:45 I went over patient's laboratory tests and x-ray results with him and his family. We discussed need for CT scan. His pain is controlled now with pain medication.  5:15 AM patient was given his CT results. He states he still having some discomfort. He was given IV Reglan and Benadryl.  Patient was rechecked at 6:15 AM he was sleeping. He states his pain is improved with minor discomfort. His wife thinks he may have gas. He was given simethicone.  Labs Review-  Results for orders placed or performed during the hospital encounter of 07/30/14  Comprehensive metabolic panel  Result Value Ref Range   Sodium 137 135 - 145 mmol/L   Potassium 3.3 (L) 3.5 - 5.1 mmol/L   Chloride 100 (L) 101 - 111 mmol/L   CO2 22 22 - 32 mmol/L   Glucose, Bld 166 (H) 65 - 99 mg/dL   BUN 21 (H) 6 - 20 mg/dL    Creatinine, Ser 1.17 0.61 - 1.24 mg/dL   Calcium 8.5 (L) 8.9 - 10.3 mg/dL   Total Protein 6.9 6.5 - 8.1 g/dL   Albumin 3.9 3.5 - 5.0 g/dL   AST 21 15 - 41 U/L   ALT 27 17 - 63 U/L   Alkaline Phosphatase 52 38 - 126 U/L   Total Bilirubin 0.9 0.3 - 1.2 mg/dL   GFR calc non Af Amer >60 >60 mL/min   GFR calc Af Amer >60 >60 mL/min   Anion gap 15 5 - 15  CBC with Differential  Result Value Ref Range   WBC 10.9 (H) 4.0 - 10.5 K/uL   RBC 5.58 4.22 - 5.81 MIL/uL   Hemoglobin 17.2 (H) 13.0 - 17.0 g/dL   HCT 47.2 39.0 - 52.0 %   MCV 84.6 78.0 - 100.0 fL   MCH 30.8 26.0 - 34.0 pg   MCHC 36.4 (H) 30.0 - 36.0 g/dL   RDW 12.6 11.5 - 15.5 %   Platelets 205 150 - 400 K/uL   Neutrophils Relative % 52 43 - 77 %   Neutro Abs 5.6 1.7 - 7.7 K/uL   Lymphocytes Relative 35 12 - 46 %   Lymphs Abs 3.8 0.7 - 4.0 K/uL   Monocytes Relative 12 3 - 12 %   Monocytes Absolute 1.3 (H) 0.1 - 1.0 K/uL   Eosinophils Relative 0 0 - 5 %   Eosinophils Absolute 0.0 0.0 - 0.7 K/uL   Basophils Relative 1 0 - 1 %   Basophils Absolute 0.1 0.0 - 0.1 K/uL   WBC Morphology ATYPICAL LYMPHOCYTES    Smear Review LARGE PLATELETS PRESENT   Lipase, blood  Result Value Ref Range   Lipase 44 22 - 51 U/L  Urinalysis, Routine w reflex microscopic (not at [facility])  Result Value Ref Range   Color, Urine YELLOW YELLOW   APPearance CLEAR CLEAR   Specific Gravity, Urine 1.010 1.005 - 1.030   pH 6.0 5.0 - 8.0   Glucose, UA NEGATIVE NEGATIVE mg/dL   Hgb urine dipstick TRACE (A) NEGATIVE   Bilirubin Urine NEGATIVE NEGATIVE   Ketones, ur 15 (A) NEGATIVE mg/dL   Protein,  ur NEGATIVE NEGATIVE mg/dL   Urobilinogen, UA 0.2 0.0 - 1.0 mg/dL   Nitrite NEGATIVE NEGATIVE   Leukocytes, UA NEGATIVE NEGATIVE  Urine microscopic-add on  Result Value Ref Range   Squamous Epithelial / LPF MANY (A) RARE   WBC, UA 0-2 <3 WBC/hpf   RBC / HPF 0-2 <3 RBC/hpf   Bacteria, UA MANY (A) RARE    Laboratory interpretation all normal except concentrated  hemoglobin consistent with dehydration, mild hypokalemia, hyperglycemia, mild leukocytosis    Imaging Review Ct Abdomen Pelvis W Contrast  07/30/2014   CLINICAL DATA:  Constant generalized abdominal pain, onset tonight around 8 p.m. Diarrhea for 5 days. Nausea. Vomiting.  EXAM: CT ABDOMEN AND PELVIS WITH CONTRAST  TECHNIQUE: Multidetector CT imaging of the abdomen and pelvis was performed using the standard protocol following bolus administration of intravenous contrast.  CONTRAST:  28mL OMNIPAQUE IOHEXOL 300 MG/ML SOLN, 142mL OMNIPAQUE IOHEXOL 300 MG/ML SOLN  COMPARISON:  None.  FINDINGS: Dependent atelectasis in the lung bases.  Mild diffuse fatty infiltration in the liver. No focal liver lesions. Gallbladder is mildly distended. No stones or wall thickening appreciated. No bile duct dilatation. Pancreas, spleen, adrenal glands, abdominal aorta, inferior vena cava, and retroperitoneal lymph nodes are unremarkable. Parapelvic cysts in the left kidney. Sub cm parenchymal cysts in the right kidney. No hydronephrosis or solid mass in the kidneys. Stomach, small bowel, and colon are not abnormally distended. No wall thickening is appreciated. No free air or free fluid in the abdomen. Abdominal wall musculature appears intact.  Pelvis: Appendix is normal. Scattered diverticula in the sigmoid colon without evidence of diverticulitis. Prostate gland is not enlarged. Bladder wall is not thickened. No free or loculated pelvic fluid collections. No pelvic mass or lymphadenopathy. Degenerative changes in the spine. No destructive bone lesions.  IMPRESSION: No acute process demonstrated in the abdomen or pelvis. No evidence of bowel obstruction or inflammation. Fatty infiltration of the liver with mild distention of the gallbladder.   Electronically Signed   By: Lucienne Capers M.D.   On: 07/30/2014 03:07   Dg Abd Acute W/chest  07/30/2014   CLINICAL DATA:  Sudden onset severe nonspecific abdominal pain since 2000  hours tonight.  EXAM: DG ABDOMEN ACUTE W/ 1V CHEST  COMPARISON:  Chest 03/28/2014  FINDINGS: Normal heart size and pulmonary vascularity. No focal airspace disease or consolidation in the lungs. No blunting of costophrenic angles. No pneumothorax. Mediastinal contours appear intact.  Gas-filled mid abdominal small bowel without distention. Scattered gas and stool in the colon. No small or large bowel distention. Changes may indicate ileus or enteritis. No free air. No abnormal air-fluid levels. No radiopaque stones. Visualized bones appear intact.  IMPRESSION: No evidence of active pulmonary disease. Gas-filled nondilated central small bowel loops may indicate ileus or enteritis.   Electronically Signed   By: Lucienne Capers M.D.   On: 07/30/2014 01:35    EKG Interpretation None    MDM   Final diagnoses:  Generalized abdominal pain   New Prescriptions   METOCLOPRAMIDE (REGLAN) 10 MG TABLET    Take 1 tablet (10 mg total) by mouth 4 (four) times daily -  before meals and at bedtime.   OMEPRAZOLE (PRILOSEC) 20 MG CAPSULE    Take 1 po BID x 2 weeks then once a day   OXYCODONE-ACETAMINOPHEN (PERCOCET/ROXICET) 5-325 MG PER TABLET    Take 1 tablet by mouth every 6 (six) hours as needed for severe pain.    Plan discharge  [name],  MD, FACEP    I personally performed the services described in this documentation, which was scribed in my presence. The recorded information has been reviewed and considered.  [name], MD, Barbette Or, MD 07/30/14 [phone]

## 2014-12-02 ENCOUNTER — Inpatient Hospital Stay (HOSPITAL_COMMUNITY)
Admission: EM | Admit: 2014-12-02 | Discharge: 2014-12-06 | DRG: 439 | Disposition: A | Discharge status: Home / Self Care | Payer: Commercial Managed Care - HMO | Attending: Internal Medicine | Admitting: Internal Medicine

## 2014-12-02 ENCOUNTER — Encounter (HOSPITAL_COMMUNITY): Payer: Self-pay | Admitting: Emergency Medicine

## 2014-12-02 ENCOUNTER — Emergency Department (HOSPITAL_COMMUNITY): Payer: Commercial Managed Care - HMO

## 2014-12-02 DIAGNOSIS — D72829 Elevated white blood cell count, unspecified: Secondary | ICD-10-CM | POA: Diagnosis present

## 2014-12-02 DIAGNOSIS — Z888 Allergy status to other drugs, medicaments and biological substances status: Secondary | ICD-10-CM | POA: Diagnosis not present

## 2014-12-02 DIAGNOSIS — G4730 Sleep apnea, unspecified: Secondary | ICD-10-CM | POA: Diagnosis present

## 2014-12-02 DIAGNOSIS — E785 Hyperlipidemia, unspecified: Secondary | ICD-10-CM | POA: Diagnosis present

## 2014-12-02 DIAGNOSIS — R6510 Systemic inflammatory response syndrome (SIRS) of non-infectious origin without acute organ dysfunction: Secondary | ICD-10-CM | POA: Diagnosis present

## 2014-12-02 DIAGNOSIS — Z7982 Long term (current) use of aspirin: Secondary | ICD-10-CM | POA: Diagnosis not present

## 2014-12-02 DIAGNOSIS — I10 Essential (primary) hypertension: Secondary | ICD-10-CM | POA: Diagnosis present

## 2014-12-02 DIAGNOSIS — K859 Acute pancreatitis without necrosis or infection, unspecified: Secondary | ICD-10-CM | POA: Insufficient documentation

## 2014-12-02 DIAGNOSIS — K8500 Idiopathic acute pancreatitis without necrosis or infection: Principal | ICD-10-CM | POA: Diagnosis present

## 2014-12-02 DIAGNOSIS — R1013 Epigastric pain: Secondary | ICD-10-CM | POA: Diagnosis not present

## 2014-12-02 DIAGNOSIS — K760 Fatty (change of) liver, not elsewhere classified: Secondary | ICD-10-CM | POA: Diagnosis present

## 2014-12-02 DIAGNOSIS — E7800 Pure hypercholesterolemia, unspecified: Secondary | ICD-10-CM | POA: Diagnosis present

## 2014-12-02 DIAGNOSIS — R740 Nonspecific elevation of levels of transaminase and lactic acid dehydrogenase [LDH]: Secondary | ICD-10-CM | POA: Diagnosis present

## 2014-12-02 DIAGNOSIS — Z79899 Other long term (current) drug therapy: Secondary | ICD-10-CM

## 2014-12-02 DIAGNOSIS — M1990 Unspecified osteoarthritis, unspecified site: Secondary | ICD-10-CM | POA: Diagnosis present

## 2014-12-02 LAB — COMPREHENSIVE METABOLIC PANEL
ALBUMIN: 4.4 g/dL (ref 3.5–5.0)
ALT: 167 U/L — ABNORMAL HIGH (ref 17–63)
AST: 216 U/L — ABNORMAL HIGH (ref 15–41)
Alkaline Phosphatase: 77 U/L (ref 38–126)
Anion gap: 11 (ref 5–15)
BUN: 18 mg/dL (ref 6–20)
CO2: 25 mmol/L (ref 22–32)
Calcium: 9.3 mg/dL (ref 8.9–10.3)
Chloride: 102 mmol/L (ref 101–111)
Creatinine, Ser: 0.94 mg/dL (ref 0.61–1.24)
GFR calc Af Amer: >60 mL/min (ref >60)
Glucose, Bld: 133 mg/dL — ABNORMAL HIGH (ref 65–99)
POTASSIUM: 4.3 mmol/L (ref 3.5–5.1)
SODIUM: 138 mmol/L (ref 135–145)
Total Bilirubin: 1.4 mg/dL — ABNORMAL HIGH (ref 0.3–1.2)
Total Protein: 7.1 g/dL (ref 6.5–8.1)

## 2014-12-02 LAB — CBC
HEMATOCRIT: 49.1 % (ref 39.0–52.0)
Hemoglobin: 17.1 g/dL — ABNORMAL HIGH (ref 13.0–17.0)
MCH: 31.4 pg (ref 26.0–34.0)
MCHC: 34.8 g/dL (ref 30.0–36.0)
MCV: 90.1 fL (ref 78.0–100.0)
Platelets: 193 10*3/uL (ref 150–400)
RBC: 5.45 MIL/uL (ref 4.22–5.81)
RDW: 12.9 % (ref 11.5–15.5)
WBC: 19.6 10*3/uL — AB (ref 4.0–10.5)

## 2014-12-02 LAB — PROTIME-INR
INR: 1.11 (ref 0.00–1.49)
Prothrombin Time: 14.5 seconds (ref 11.6–15.2)

## 2014-12-02 LAB — LIPID PANEL
CHOL/HDL RATIO: 3.9 ratio
CHOLESTEROL: 159 mg/dL (ref 0–200)
HDL: 41 mg/dL (ref >40)
LDL Cholesterol: 109 mg/dL — ABNORMAL HIGH (ref 0–99)
TRIGLYCERIDES: 46 mg/dL (ref <150)
VLDL: 9 mg/dL (ref 0–40)

## 2014-12-02 LAB — URINALYSIS, ROUTINE W REFLEX MICROSCOPIC
Bilirubin Urine: NEGATIVE
GLUCOSE, UA: NEGATIVE mg/dL
Hgb urine dipstick: NEGATIVE
Ketones, ur: NEGATIVE mg/dL
LEUKOCYTES UA: NEGATIVE
NITRITE: NEGATIVE
PH: 5 (ref 5.0–8.0)
Protein, ur: NEGATIVE mg/dL
SPECIFIC GRAVITY, URINE: 1.018 (ref 1.005–1.030)
Urobilinogen, UA: 1 mg/dL (ref 0.0–1.0)

## 2014-12-02 LAB — TSH: TSH: 1.173 u[IU]/mL (ref 0.350–4.500)

## 2014-12-02 LAB — LIPASE, BLOOD

## 2014-12-02 LAB — TROPONIN I: Troponin I: <0.03 ng/mL (ref <0.031)

## 2014-12-02 MED ORDER — ENOXAPARIN SODIUM 40 MG/0.4ML ~~LOC~~ SOLN
40.0000 mg | SUBCUTANEOUS | Status: DC
  Administered 2014-12-02 – 2014-12-05 (×4): 40 mg via SUBCUTANEOUS
  Filled 2014-12-02 (×4): qty 0.4

## 2014-12-02 MED ORDER — CETYLPYRIDINIUM CHLORIDE 0.05 % MT LIQD
7.0000 mL | Freq: Two times a day (BID) | OROMUCOSAL | Status: DC
  Administered 2014-12-03 (×2): 7 mL via OROMUCOSAL

## 2014-12-02 MED ORDER — PANTOPRAZOLE SODIUM 40 MG IV SOLR
40.0000 mg | Freq: Once | INTRAVENOUS | Status: AC
  Administered 2014-12-02: 40 mg via INTRAVENOUS
  Filled 2014-12-02: qty 40

## 2014-12-02 MED ORDER — HYDROMORPHONE HCL 1 MG/ML IJ SOLN
1.0000 mg | INTRAMUSCULAR | Status: AC | PRN
  Administered 2014-12-02 (×2): 1 mg via INTRAVENOUS
  Filled 2014-12-02 (×2): qty 1

## 2014-12-02 MED ORDER — CHLORHEXIDINE GLUCONATE 0.12 % MT SOLN
15.0000 mL | Freq: Two times a day (BID) | OROMUCOSAL | Status: DC
  Administered 2014-12-02 – 2014-12-06 (×8): 15 mL via OROMUCOSAL
  Filled 2014-12-02 (×8): qty 15

## 2014-12-02 MED ORDER — PANTOPRAZOLE SODIUM 40 MG IV SOLR
40.0000 mg | Freq: Two times a day (BID) | INTRAVENOUS | Status: DC
  Administered 2014-12-02 – 2014-12-03 (×3): 40 mg via INTRAVENOUS
  Filled 2014-12-02 (×4): qty 40

## 2014-12-02 MED ORDER — IOHEXOL 300 MG/ML  SOLN
100.0000 mL | Freq: Once | INTRAMUSCULAR | Status: AC | PRN
  Administered 2014-12-02: 100 mL via INTRAVENOUS

## 2014-12-02 MED ORDER — ONDANSETRON HCL 4 MG/2ML IJ SOLN
4.0000 mg | Freq: Four times a day (QID) | INTRAMUSCULAR | Status: DC | PRN
  Filled 2014-12-02: qty 2

## 2014-12-02 MED ORDER — FENTANYL CITRATE (PF) 100 MCG/2ML IJ SOLN
INTRAMUSCULAR | Status: AC
  Filled 2014-12-02: qty 2

## 2014-12-02 MED ORDER — ACETAMINOPHEN 325 MG PO TABS: 650.0000 mg | ORAL_TABLET | Freq: Four times a day (QID) | ORAL | Status: DC | PRN

## 2014-12-02 MED ORDER — HYDROMORPHONE HCL 1 MG/ML IJ SOLN
INTRAMUSCULAR | Status: AC
  Administered 2014-12-02: 1 mg via INTRAVENOUS
  Filled 2014-12-02: qty 1

## 2014-12-02 MED ORDER — SODIUM CHLORIDE 0.9 % IV SOLN
Freq: Once | INTRAVENOUS | Status: AC
  Administered 2014-12-02: 09:00:00 via INTRAVENOUS

## 2014-12-02 MED ORDER — OXYCODONE-ACETAMINOPHEN 5-325 MG PO TABS
1.0000 | ORAL_TABLET | Freq: Four times a day (QID) | ORAL | Status: DC | PRN
  Administered 2014-12-02: 2 via ORAL
  Filled 2014-12-02: qty 2

## 2014-12-02 MED ORDER — SIMVASTATIN 10 MG PO TABS
20.0000 mg | ORAL_TABLET | Freq: Every evening | ORAL | Status: DC
  Administered 2014-12-02 – 2014-12-05 (×4): 20 mg via ORAL
  Filled 2014-12-02 (×4): qty 2

## 2014-12-02 MED ORDER — ADULT MULTIVITAMIN W/MINERALS CH
1.0000 | ORAL_TABLET | Freq: Every day | ORAL | Status: DC
  Administered 2014-12-03 – 2014-12-06 (×4): 1 via ORAL
  Filled 2014-12-02 (×4): qty 1

## 2014-12-02 MED ORDER — SODIUM CHLORIDE 0.9 % IV SOLN
INTRAVENOUS | Status: DC
  Administered 2014-12-02: 1000 mL via INTRAVENOUS
  Administered 2014-12-03 – 2014-12-04 (×4): via INTRAVENOUS

## 2014-12-02 MED ORDER — IOHEXOL 300 MG/ML  SOLN
50.0000 mL | Freq: Once | INTRAMUSCULAR | Status: AC | PRN
  Administered 2014-12-02: 50 mL via ORAL

## 2014-12-02 MED ORDER — ONDANSETRON HCL 4 MG/2ML IJ SOLN
4.0000 mg | Freq: Once | INTRAMUSCULAR | Status: AC
  Administered 2014-12-02: 4 mg via INTRAVENOUS
  Filled 2014-12-02: qty 2

## 2014-12-02 MED ORDER — FENTANYL CITRATE (PF) 100 MCG/2ML IJ SOLN
50.0000 ug | Freq: Once | INTRAMUSCULAR | Status: AC
  Administered 2014-12-02: 50 ug via INTRAVENOUS

## 2014-12-02 MED ORDER — ONDANSETRON HCL 4 MG PO TABS: 4.0000 mg | ORAL_TABLET | Freq: Four times a day (QID) | ORAL | Status: DC | PRN

## 2014-12-02 MED ORDER — ASPIRIN EC 81 MG PO TBEC
81.0000 mg | DELAYED_RELEASE_TABLET | ORAL | Status: DC
  Administered 2014-12-04 – 2014-12-06 (×2): 81 mg via ORAL
  Filled 2014-12-02 (×3): qty 1

## 2014-12-02 MED ORDER — LOSARTAN POTASSIUM 50 MG PO TABS
25.0000 mg | ORAL_TABLET | Freq: Every day | ORAL | Status: DC
  Administered 2014-12-03 – 2014-12-06 (×4): 25 mg via ORAL
  Filled 2014-12-02 (×4): qty 1

## 2014-12-02 MED ORDER — HYDROMORPHONE HCL 1 MG/ML IJ SOLN
1.0000 mg | INTRAMUSCULAR | Status: DC | PRN
  Administered 2014-12-02 – 2014-12-03 (×4): 1 mg via INTRAVENOUS
  Filled 2014-12-02 (×3): qty 1

## 2014-12-02 MED ORDER — ACETAMINOPHEN 650 MG RE SUPP: 650.0000 mg | Freq: Four times a day (QID) | RECTAL | Status: DC | PRN

## 2014-12-02 NOTE — ED Notes (Signed)
MD at bedside. Unable to collect labs

## 2014-12-02 NOTE — ED Provider Notes (Signed)
CSN: [id number]     Arrival date & time 12/02/14  [id number] History   First MD Initiated Contact with Patient 12/02/14 [phone]     Chief Complaint  Patient presents with  . Abdominal Pain      HPI  She presents for evaluation of abdominal pain. He states symptoms started roughly 5:30 this morning awakened him from sleep. States that he had a normal day yesterday and a normal night. Sudden symptoms this morning. He states he had a similar episode in June. Review of the chart per patient he had a CT scan showing diverticuli, but no acute abnormalities. He does not recall treatment given. States "got better". Does not have abdominal symptoms frequently.  Nauseous morning but no vomiting. Normal bowel movement yesterday. No urinary symptoms. Denies chest pain. Denies shortness of breath. No symptoms to upper chest, neck back or jaw.  Past Medical History  Diagnosis Date  . Hypertension   . Arthritis     in his knees  . 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 Sleep apnea     been tested last yr.  "moderate"  wears the mask  . Diverticulitis    Past Surgical History  Procedure Laterality Date  . Knee arthroscopy      x 3  . Vasectomy    . Colonoscopy      x 4  . Tonsillectomy    . Eye surgery      lasik back in 2014  . Cervical discectomy  04/24/2014    C6 C7  . Anterior cervical decomp/discectomy fusion N/A 04/24/2014    Procedure: ANTERIOR CERVICAL DISCECTOMY FUSION C6 - C7 1 LEVEL;  Surgeon: Melina Schools, MD;  Location: [facility];  Service: Orthopedics;  Laterality: N/A;   History reviewed. No pertinent family history. Social History  Substance Use Topics  . Smoking status: Never Smoker   . Smokeless tobacco: Never Used  . Alcohol Use: Yes     Comment: during the hollidays    Review of Systems  Constitutional: Negative for fever, chills, diaphoresis, appetite change and fatigue.  HENT: Negative for mouth sores, sore throat and trouble swallowing.   Eyes: Negative for  visual disturbance.  Respiratory: Negative for cough, chest tightness, shortness of breath and wheezing.   Cardiovascular: Negative for chest pain.  Gastrointestinal: Positive for nausea and abdominal pain. Negative for vomiting, diarrhea and abdominal distention.  Endocrine: Negative for polydipsia, polyphagia and polyuria.  Genitourinary: Negative for dysuria, frequency and hematuria.  Musculoskeletal: Negative for gait problem.  Skin: Negative for color change, pallor and rash.  Neurological: Negative for dizziness, syncope, light-headedness and headaches.  Hematological: Does not bruise/bleed easily.  Psychiatric/Behavioral: Negative for behavioral problems and confusion.      Allergies  Lisinopril  Home Medications   Prior to Admission medications   Medication Sig Start Date End Date Taking? Authorizing Provider  docusate sodium (COLACE) 100 MG capsule Take 1 capsule (100 mg total) by mouth 3 (three) times daily as needed for mild constipation. Patient not taking: Reported on 06/28/2014 04/24/14   Melina Schools, MD  hydrochlorothiazide (HYDRODIURIL) 25 MG tablet Take 25 mg by mouth daily. 01/25/14   Historical Provider, MD  losartan (COZAAR) 25 MG tablet Take 25 mg by mouth daily. 01/25/14   Historical Provider, MD  methocarbamol (ROBAXIN) 500 MG tablet Take 1 tablet (500 mg total) by mouth 3 (three) times daily as needed for muscle spasms. 04/24/14   Dahari  Rolena Infante, MD  metoCLOPramide (REGLAN) 10 MG tablet Take 1 tablet (10 mg total) by mouth 4 (four) times daily -  before meals and at bedtime. 07/30/14   [name], MD  omeprazole (PRILOSEC) 20 MG capsule Take 1 po BID x 2 weeks then once a day 07/30/14   [name], MD  ondansetron (ZOFRAN) 4 MG tablet Take 1 tablet (4 mg total) by mouth every 8 (eight) hours as needed for nausea or vomiting. 04/24/14   Melina Schools, MD  oxyCODONE-acetaminophen (PERCOCET/ROXICET) 5-325 MG per tablet Take 1 tablet by mouth every 6 (six) hours as needed for  severe pain. 07/30/14   [name], MD  simvastatin (ZOCOR) 20 MG tablet Take 20 mg by mouth daily.    Historical Provider, MD   BP 119/78 mmHg  Pulse 59  Temp(Src) 97.5 F (36.4 C) (Axillary)  Resp 24  SpO2 93% Physical Exam  Constitutional: He is oriented to person, place, and time. He appears well-developed and well-nourished. No distress.  HENT:  Head: Normocephalic.  Eyes: Conjunctivae are normal. Pupils are equal, round, and reactive to light. No scleral icterus.  Neck: Normal range of motion. Neck supple. No thyromegaly present.  Cardiovascular: Normal rate and regular rhythm.  Exam reveals no gallop and no friction rub.   No murmur heard. Pulmonary/Chest: Effort normal and breath sounds normal. No respiratory distress. He has no wheezes. He has no rales.  Clear breath sounds.  Abdominal: Soft. Bowel sounds are normal. He exhibits no distension. There is no tenderness. There is no rebound.    Musculoskeletal: Normal range of motion.  Neurological: He is alert and oriented to person, place, and time.  Skin: Skin is warm and dry. No rash noted.  Psychiatric: He has a normal mood and affect. His behavior is normal.    ED Course  Procedures (including critical care time) Labs Review Labs Reviewed  LIPASE, BLOOD - Abnormal; Notable for the following:    Lipase >3000 (*)    All other components within normal limits  COMPREHENSIVE METABOLIC PANEL - Abnormal; Notable for the following:    Glucose, Bld 133 (*)    AST 216 (*)    ALT 167 (*)    Total Bilirubin 1.4 (*)    All other components within normal limits  CBC - Abnormal; Notable for the following:    WBC 19.6 (*)    Hemoglobin 17.1 (*)    All other components within normal limits  URINALYSIS, ROUTINE W REFLEX MICROSCOPIC (NOT AT [facility]) - Abnormal; Notable for the following:    Color, Urine AMBER (*)    APPearance CLOUDY (*)    All other components within normal limits  TROPONIN I  LIPID PANEL    Imaging  Review Ct Abdomen Pelvis W Contrast  12/02/2014  CLINICAL DATA:  History of diverticulitis. The patient had acute onset of abdominal pain at 5:30 a.m. 12/01/2014. No known injury. Initial encounter. EXAM: CT ABDOMEN AND PELVIS WITH CONTRAST TECHNIQUE: Multidetector CT imaging of the abdomen and pelvis was performed using the standard protocol following bolus administration of intravenous contrast. CONTRAST:  100 mL OMNIPAQUE IOHEXOL 300 MG/ML SOLN, 50 mL OMNIPAQUE IOHEXOL 300 MG/ML SOLN COMPARISON:  CT abdomen and pelvis 07/30/2014. FINDINGS: There is some dependent atelectasis in the lung bases. No pleural or pericardial effusion. There is stranding and a small amount of fluid about the pancreas. The pancreas enhances homogeneously. The splenic and portal veins are patent. No focal fluid collection is identified. Mild fatty infiltration  of the liver without focal lesion is seen. The gallbladder, biliary tree, adrenal glands and spleen are unremarkable. A few small bilateral renal cysts are seen. The kidneys are otherwise unremarkable. Urinary bladder, seminal vesicles and prostate gland appear normal. There is some sigmoid diverticulosis but no diverticulitis is identified. The colon is otherwise unremarkable. The stomach, small bowel and appendix appear normal. No lymphadenopathy is identified. Scattered aortoiliac atherosclerosis without aneurysm is noted. No focal bony abnormality is identified. IMPRESSION: Infiltration of fat about the pancreas is consistent with pancreatitis. No pseudocyst or evidence of pancreatic necrosis is identified. Mild appearing fatty infiltration of the liver. Diverticulosis without diverticulitis. Electronically Signed   By: Inge Rise M.D.   On: 12/02/2014 10:58   Dg Chest Port 1 View  12/02/2014  CLINICAL DATA:  Abdominal pain today.  Initial encounter. EXAM: PORTABLE CHEST 1 VIEW COMPARISON:  Single view of the chest 07/30/2014. FINDINGS: Lung volumes are low but the  lungs are clear. Heart size is normal. No pneumothorax or pleural effusion. IMPRESSION: No acute disease. Electronically Signed   By: Inge Rise M.D.   On: 12/02/2014 09:32   I have personally reviewed and evaluated these images and lab results as part of my medical decision-making.   EKG Interpretation None      MDM   Final diagnoses:  Acute pancreatitis, unspecified pancreatitis type    She has been taking ibuprofen a few times a day for a painful tooth. Does not use tobacco products. Nondrinker. Patient does have known diverticuli. Plan will be CT imaging to rule out diverticulitis or other acute process. IV placed. Given fentanyl Zofran.  Lab and CT show pancreatitis.  Pt does not consume ETOH regularly.  1 drink on Sunday.  None for over 1 month prior.  No food intolerance, or biliary symptoms. Does take hydrochlorothiazide. Takes simvastatin for high cholesterol. Has never had high triglycerides. I discussed the case with hospitalist. Patient be admitted. His pain is improved but not resolved after multiple doses IV pain medications.    Tanna Furry, MD 12/02/14 1134

## 2014-12-02 NOTE — H&P (Signed)
[facility] History and Physical  KINNETH FUJIWARA UJW:[id number] DOB: [birth year] DOA: 12/02/2014  Referring physician: EDP PCP: [name], MD   Chief Complaint: Epigastric abdominal pain  HPI: PREET PERRIER is a 59 y.o. male with hospital history of hypertension and dyslipidemia. Patient given to the hospital complaining about epigastric pain. Patient was in his usual state of health until earlier this morning, he will come about 5:30 in the morning to go to work and he felt nauseous, soon this followed by epigastric abdominal pain, dull, 8/10, does not radiate patient felt he is going to throw up but he did not. He went to work and he couldn't tolerate the pains are given to the hospital for further evaluation. In the ED labs showed lipase of >3000, elevated AST/ALT and WBC of 19.6. CT scan showed acute pancreatitis without pseudocyst or necrosis.  Review of Systems:  Constitutional: negative for anorexia, fevers and sweats Eyes: negative for irritation, redness and visual disturbance Ears, nose, mouth, throat, and face: negative for earaches, epistaxis, nasal congestion and sore throat Respiratory: negative for cough, dyspnea on exertion, sputum and wheezing Cardiovascular: negative for chest pain, dyspnea, lower extremity edema, orthopnea, palpitations and syncope Gastrointestinal: Per history of present illness Genitourinary:negative for dysuria, frequency and hematuria Hematologic/lymphatic: negative for bleeding, easy bruising and lymphadenopathy Musculoskeletal:negative for arthralgias, muscle weakness and stiff joints Neurological: negative for coordination problems, gait problems, headaches and weakness Endocrine: negative for diabetic symptoms including polydipsia, polyuria and weight loss Allergic/Immunologic: negative for anaphylaxis, hay fever and urticaria  Past Medical History  Diagnosis Date  . Hypertension   . Arthritis     in his knees  . Hepatitis    had hep A   about 28 yrs ago  . Sleep apnea     been tested last yr.  "moderate"  wears the mask  . Diverticulitis    Past Surgical History  Procedure Laterality Date  . Knee arthroscopy      x 3  . Vasectomy    . Colonoscopy      x 4  . Tonsillectomy    . Eye surgery      lasik back in 2014  . Cervical discectomy  04/24/2014    C6 C7  . Anterior cervical decomp/discectomy fusion N/A 04/24/2014    Procedure: ANTERIOR CERVICAL DISCECTOMY FUSION C6 - C7 1 LEVEL;  Surgeon: Melina Schools, MD;  Location: [facility];  Service: Orthopedics;  Laterality: N/A;   Social History:   reports that he has never smoked. He has never used smokeless tobacco. He reports that he drinks alcohol. He reports that he does not use illicit drugs.  Allergies  Allergen Reactions  . Lisinopril Cough    Family history: No family history of premature CAD  Prior to Admission medications   Medication Sig Start Date End Date Taking? Authorizing Provider  aspirin EC 81 MG tablet Take 81 mg by mouth every other day.   Yes Historical Provider, MD  celecoxib (CELEBREX) 200 MG capsule Take 200 mg by mouth daily.   Yes Historical Provider, MD  cholecalciferol (VITAMIN D) 1000 UNITS tablet Take 1,000 Units by mouth daily.   Yes Historical Provider, MD  Coenzyme Q10 (COQ10) 100 MG CAPS Take 1 capsule by mouth daily.   Yes Historical Provider, MD  folic acid (FOLVITE) 562 MCG tablet Take 400 mcg by mouth daily.   Yes Historical Provider, MD  Glucosamine-Chondroit-Vit C-Mn (GLUCOSAMINE 1500 COMPLEX) CAPS Take 1 capsule by mouth daily.   Yes  Historical Provider, MD  hydrochlorothiazide (HYDRODIURIL) 25 MG tablet Take 25 mg by mouth daily. 01/25/14  Yes Historical Provider, MD  ibuprofen (ADVIL,MOTRIN) 600 MG tablet Take 600 mg by mouth every 6 (six) hours as needed for fever, headache, mild pain, moderate pain or cramping.   Yes Historical Provider, MD  losartan (COZAAR) 25 MG tablet Take 25 mg by mouth daily. 01/25/14  Yes  Historical Provider, MD  Multiple Vitamin (MULTIVITAMIN WITH MINERALS) TABS tablet Take 1 tablet by mouth daily.   Yes Historical Provider, MD  Olive Leaf Extract 500 MG CAPS Take 1 capsule by mouth daily.   Yes Historical Provider, MD  Probiotic Product (PROBIOTIC DAILY) CAPS Take 1 capsule by mouth daily.   Yes Historical Provider, MD  simvastatin (ZOCOR) 20 MG tablet Take 20 mg by mouth every evening.    Yes Historical Provider, MD  zolpidem (AMBIEN) 10 MG tablet Take 10 mg by mouth at bedtime as needed for sleep.   Yes Historical Provider, MD  metoCLOPramide (REGLAN) 10 MG tablet Take 1 tablet (10 mg total) by mouth 4 (four) times daily -  before meals and at bedtime. Patient not taking: Reported on 12/02/2014 07/30/14   [name], MD  omeprazole (PRILOSEC) 20 MG capsule Take 1 po BID x 2 weeks then once a day Patient not taking: Reported on 12/02/2014 07/30/14   [name], MD  oxyCODONE-acetaminophen (PERCOCET/ROXICET) 5-325 MG per tablet Take 1 tablet by mouth every 6 (six) hours as needed for severe pain. Patient not taking: Reported on 12/02/2014 07/30/14   [name], MD   Physical Exam: Filed Vitals:   12/02/14 1117  BP: 119/78  Pulse: 59  Temp:   Resp: 24   Constitutional: Oriented to person, place, and time. Well-developed and well-nourished. Cooperative.  Head: Normocephalic and atraumatic.  Nose: Nose normal.  Mouth/Throat: Uvula is midline, oropharynx is clear and moist and mucous membranes are normal.  Eyes: Conjunctivae and EOM are normal. Pupils are equal, round, and reactive to light.  Neck: Trachea normal and normal range of motion. Neck supple.  Cardiovascular: Normal rate, regular rhythm, S1 normal, S2 normal, normal heart sounds and intact distal pulses.   Pulmonary/Chest: Effort normal and breath sounds normal.  Abdominal: Soft. Bowel sounds are normal. There is no hepatosplenomegaly. There is no tenderness.  Musculoskeletal: Normal range of motion.  Neurological:  Alert and oriented to person, place, and time. Has normal strength. No cranial nerve deficit or sensory deficit.  Skin: Skin is warm, dry and intact.  Psychiatric: Has a normal mood and affect. Speech is normal and behavior is normal.   Labs on Admission:  Basic Metabolic Panel:  Recent Labs Lab 12/02/14 0850  NA 138  K 4.3  CL 102  CO2 25  GLUCOSE 133*  BUN 18  CREATININE 0.94  CALCIUM 9.3   Liver Function Tests:  Recent Labs Lab 12/02/14 0850  AST 216*  ALT 167*  ALKPHOS 77  BILITOT 1.4*  PROT 7.1  ALBUMIN 4.4    Recent Labs Lab 12/02/14 0850  LIPASE >3000*   No results for input(s): AMMONIA in the last 168 hours. CBC:  Recent Labs Lab 12/02/14 0850  WBC 19.6*  HGB 17.1*  HCT 49.1  MCV 90.1  PLT 193   Cardiac Enzymes:  Recent Labs Lab 12/02/14 0850  TROPONINI <0.03    BNP (last 3 results) No results for input(s): BNP in the last 8760 hours.  ProBNP (last 3 results) No results for input(s): PROBNP  in the last 8760 hours.  CBG: No results for input(s): GLUCAP in the last 168 hours.  Radiological Exams on Admission: Ct Abdomen Pelvis W Contrast  12/02/2014  CLINICAL DATA:  History of diverticulitis. The patient had acute onset of abdominal pain at 5:30 a.m. 12/01/2014. No known injury. Initial encounter. EXAM: CT ABDOMEN AND PELVIS WITH CONTRAST TECHNIQUE: Multidetector CT imaging of the abdomen and pelvis was performed using the standard protocol following bolus administration of intravenous contrast. CONTRAST:  100 mL OMNIPAQUE IOHEXOL 300 MG/ML SOLN, 50 mL OMNIPAQUE IOHEXOL 300 MG/ML SOLN COMPARISON:  CT abdomen and pelvis 07/30/2014. FINDINGS: There is some dependent atelectasis in the lung bases. No pleural or pericardial effusion. There is stranding and a small amount of fluid about the pancreas. The pancreas enhances homogeneously. The splenic and portal veins are patent. No focal fluid collection is identified. Mild fatty infiltration of the  liver without focal lesion is seen. The gallbladder, biliary tree, adrenal glands and spleen are unremarkable. A few small bilateral renal cysts are seen. The kidneys are otherwise unremarkable. Urinary bladder, seminal vesicles and prostate gland appear normal. There is some sigmoid diverticulosis but no diverticulitis is identified. The colon is otherwise unremarkable. The stomach, small bowel and appendix appear normal. No lymphadenopathy is identified. Scattered aortoiliac atherosclerosis without aneurysm is noted. No focal bony abnormality is identified. IMPRESSION: Infiltration of fat about the pancreas is consistent with pancreatitis. No pseudocyst or evidence of pancreatic necrosis is identified. Mild appearing fatty infiltration of the liver. Diverticulosis without diverticulitis. Electronically Signed   By: Inge Rise M.D.   On: 12/02/2014 10:58   Dg Chest Port 1 View  12/02/2014  CLINICAL DATA:  Abdominal pain today.  Initial encounter. EXAM: PORTABLE CHEST 1 VIEW COMPARISON:  Single view of the chest 07/30/2014. FINDINGS: Lung volumes are low but the lungs are clear. Heart size is normal. No pneumothorax or pleural effusion. IMPRESSION: No acute disease. Electronically Signed   By: Inge Rise M.D.   On: 12/02/2014 09:32    EKG: Independently reviewed.   Assessment/Plan Principal Problem:   Idiopathic acute pancreatitis without infection or necrosis Active Problems:   HTN (hypertension)   Dyslipidemia   Leukocytosis   Epigastric abdominal pain   SIRS (systemic inflammatory response syndrome) ([place])    Acute pancreatitis Patient presented to the hospital with epigastric pain along with nausea without vomiting. Lipase of >3000 and CT scan findings consistent with acute pancreatitis. Likely idiopathic versus medication induced secondary to HCTZ. Check triglycerides. Treat with bowel rest, aggressive IV fluid hydration and pain control with narcotics.  SIRS Met SIRS  criteria with WBCs of 19.6 and respiratory rate of 24. This is likely secondary to direct pancreatitis rather than sepsis.  Leukocytosis This is likely secondary to the acute pancreatitis. Monitor, no evidence of fever or chills.  Epigastric abdominal pain Secondary to the acute pancreatitis, control with narcotic pain medications.   Hypertension Continue losartan and hold HCTZ.  Code Status: Full code Family Communication: Plan discussed with the patient in the presence of his wife at bedside. Disposition Plan: MedSurg  Time spent: 70 minutes  Glorimar Stroope A, MD [facility] Pager [phone]

## 2014-12-02 NOTE — ED Notes (Signed)
Patient transported to CT 

## 2014-12-02 NOTE — ED Notes (Signed)
Bed: [id number] Expected date:  Expected time:  Means of arrival:  Comments: Ems-abd pain

## 2014-12-02 NOTE — ED Notes (Signed)
Pt reports he was diagnosed with diverticulitis during the summer. Got better, but began to have similar pains that began at 0530 this am. Pt complains of generalized abd pain, no n/v/d. LBM this am. Abd looks distended.

## 2014-12-03 ENCOUNTER — Inpatient Hospital Stay (HOSPITAL_COMMUNITY): Payer: Commercial Managed Care - HMO

## 2014-12-03 LAB — CBC
HEMATOCRIT: 44.3 % (ref 39.0–52.0)
Hemoglobin: 15.3 g/dL (ref 13.0–17.0)
MCH: 31.5 pg (ref 26.0–34.0)
MCHC: 34.5 g/dL (ref 30.0–36.0)
MCV: 91.2 fL (ref 78.0–100.0)
Platelets: 153 10*3/uL (ref 150–400)
RBC: 4.86 MIL/uL (ref 4.22–5.81)
RDW: 13 % (ref 11.5–15.5)
WBC: 11.1 10*3/uL — AB (ref 4.0–10.5)

## 2014-12-03 LAB — COMPREHENSIVE METABOLIC PANEL
ALK PHOS: 68 U/L (ref 38–126)
ALT: 119 U/L — ABNORMAL HIGH (ref 17–63)
AST: 53 U/L — AB (ref 15–41)
Albumin: 3.9 g/dL (ref 3.5–5.0)
Anion gap: 7 (ref 5–15)
BILIRUBIN TOTAL: 1.4 mg/dL — AB (ref 0.3–1.2)
BUN: 15 mg/dL (ref 6–20)
CALCIUM: 8.7 mg/dL — AB (ref 8.9–10.3)
CO2: 29 mmol/L (ref 22–32)
Chloride: 102 mmol/L (ref 101–111)
Creatinine, Ser: 0.89 mg/dL (ref 0.61–1.24)
GFR calc Af Amer: >60 mL/min (ref >60)
Glucose, Bld: 100 mg/dL — ABNORMAL HIGH (ref 65–99)
POTASSIUM: 3.6 mmol/L (ref 3.5–5.1)
Sodium: 138 mmol/L (ref 135–145)
TOTAL PROTEIN: 6.3 g/dL — AB (ref 6.5–8.1)

## 2014-12-03 LAB — HEMOGLOBIN A1C
Hgb A1c MFr Bld: 5.6 % (ref 4.8–5.6)
Mean Plasma Glucose: 114 mg/dL

## 2014-12-03 MED ORDER — ZOLPIDEM TARTRATE 5 MG PO TABS
5.0000 mg | ORAL_TABLET | Freq: Every evening | ORAL | Status: DC | PRN
  Administered 2014-12-03 – 2014-12-04 (×2): 5 mg via ORAL
  Filled 2014-12-03 (×2): qty 1

## 2014-12-03 NOTE — Progress Notes (Signed)
[facility] PROGRESS NOTE   Russell Carney [id number] DOB: 1955-02-05 DOA: 12/02/2014 PCP: [name], MD  HPI/Subjective: Abdominal pain is 5/10, better than yesterday, denies nausea. Still does not have appetite, continue NPO status, consider clear liquids in a.m.  Assessment/Plan: Principal Problem:   Idiopathic acute pancreatitis without infection or necrosis Active Problems:   HTN (hypertension)   Dyslipidemia   Leukocytosis   Epigastric abdominal pain   SIRS (systemic inflammatory response syndrome) ([place])   Acute pancreatitis Patient presented to the hospital with epigastric pain along with nausea without vomiting. Lipase of >3000 and CT scan findings consistent with acute pancreatitis. Likely idiopathic versus medication induced secondary to HCTZ. Normal triglycerides, check RUQ ultrasound to rule out gallstone pancreatitis Continue bowel rest, aggressive IV fluid hydration and pain control with narcotics.  SIRS Met SIRS criteria with WBCs of 19.6 and respiratory rate of 24. This is likely secondary to SIRS from acute pancreatitis rather than sepsis.  Leukocytosis This is likely secondary to the acute pancreatitis. WBC 19.6 yesterday, down to 11.1 today. Monitor, no evidence of fever or chills.  Epigastric abdominal pain Secondary to the acute pancreatitis, control with narcotic pain medications.   Hypertension Continue losartan and hold HCTZ.  Code Status: Full Code Family Communication: Plan discussed with the patient. Disposition Plan: Remains inpatient Diet: Diet NPO time specified Except for: Sips with Meds  Consultants:  None  Procedures:  None  Antibiotics:  None   Objective: Filed Vitals:   12/03/14 0531  BP: 101/56  Pulse: 73  Temp: 98.3 F (36.8 C)  Resp: 16    Intake/Output Summary (Last 24 hours) at 12/03/14 1203 Last data filed at 12/03/14 0757  Gross per 24 hour  Intake    300 ml  Output   1000 ml  Net    -700 ml   Filed Weights   12/02/14 1521  Weight: 94.5 kg (208 lb 5.4 oz)    Exam: General: Alert and awake, oriented x3, not in any acute distress. HEENT: anicteric sclera, pupils reactive to light and accommodation, EOMI CVS: S1-S2 clear, no murmur rubs or gallops Chest: clear to auscultation bilaterally, no wheezing, rales or rhonchi Abdomen: soft nontender, nondistended, normal bowel sounds, no organomegaly Extremities: no cyanosis, clubbing or edema noted bilaterally Neuro: Cranial nerves II-XII intact, no focal neurological deficits  Data Reviewed: Basic Metabolic Panel:  Recent Labs Lab 12/02/14 0850 12/03/14 0420  NA 138 138  K 4.3 3.6  CL 102 102  CO2 25 29  GLUCOSE 133* 100*  BUN 18 15  CREATININE 0.94 0.89  CALCIUM 9.3 8.7*   Liver Function Tests:  Recent Labs Lab 12/02/14 0850 12/03/14 0420  AST 216* 53*  ALT 167* 119*  ALKPHOS 77 68  BILITOT 1.4* 1.4*  PROT 7.1 6.3*  ALBUMIN 4.4 3.9    Recent Labs Lab 12/02/14 0850  LIPASE >3000*   No results for input(s): AMMONIA in the last 168 hours. CBC:  Recent Labs Lab 12/02/14 0850 12/03/14 0420  WBC 19.6* 11.1*  HGB 17.1* 15.3  HCT 49.1 44.3  MCV 90.1 91.2  PLT 193 153   Cardiac Enzymes:  Recent Labs Lab 12/02/14 0850  TROPONINI <0.03   BNP (last 3 results) No results for input(s): BNP in the last 8760 hours.  ProBNP (last 3 results) No results for input(s): PROBNP in the last 8760 hours.  CBG: No results for input(s): GLUCAP in the last 168 hours.  Micro No results found for this or any previous visit (  from the past 240 hour(s)).   Studies: Ct Abdomen Pelvis W Contrast  12/02/2014  CLINICAL DATA:  History of diverticulitis. The patient had acute onset of abdominal pain at 5:30 a.m. 12/01/2014. No known injury. Initial encounter. EXAM: CT ABDOMEN AND PELVIS WITH CONTRAST TECHNIQUE: Multidetector CT imaging of the abdomen and pelvis was performed using the standard protocol  following bolus administration of intravenous contrast. CONTRAST:  100 mL OMNIPAQUE IOHEXOL 300 MG/ML SOLN, 50 mL OMNIPAQUE IOHEXOL 300 MG/ML SOLN COMPARISON:  CT abdomen and pelvis 07/30/2014. FINDINGS: There is some dependent atelectasis in the lung bases. No pleural or pericardial effusion. There is stranding and a small amount of fluid about the pancreas. The pancreas enhances homogeneously. The splenic and portal veins are patent. No focal fluid collection is identified. Mild fatty infiltration of the liver without focal lesion is seen. The gallbladder, biliary tree, adrenal glands and spleen are unremarkable. A few small bilateral renal cysts are seen. The kidneys are otherwise unremarkable. Urinary bladder, seminal vesicles and prostate gland appear normal. There is some sigmoid diverticulosis but no diverticulitis is identified. The colon is otherwise unremarkable. The stomach, small bowel and appendix appear normal. No lymphadenopathy is identified. Scattered aortoiliac atherosclerosis without aneurysm is noted. No focal bony abnormality is identified. IMPRESSION: Infiltration of fat about the pancreas is consistent with pancreatitis. No pseudocyst or evidence of pancreatic necrosis is identified. Mild appearing fatty infiltration of the liver. Diverticulosis without diverticulitis. Electronically Signed   By: Inge Rise M.D.   On: 12/02/2014 10:58   Dg Chest Port 1 View  12/02/2014  CLINICAL DATA:  Abdominal pain today.  Initial encounter. EXAM: PORTABLE CHEST 1 VIEW COMPARISON:  Single view of the chest 07/30/2014. FINDINGS: Lung volumes are low but the lungs are clear. Heart size is normal. No pneumothorax or pleural effusion. IMPRESSION: No acute disease. Electronically Signed   By: Inge Rise M.D.   On: 12/02/2014 09:32    Scheduled Meds: . antiseptic oral rinse  7 mL Mouth Rinse q12n4p  . aspirin EC  81 mg Oral QODAY  . chlorhexidine  15 mL Mouth Rinse BID  . enoxaparin  (LOVENOX) injection  40 mg Subcutaneous Q24H  . losartan  25 mg Oral Daily  . multivitamin with minerals  1 tablet Oral Daily  . pantoprazole (PROTONIX) IV  40 mg Intravenous Q12H  . simvastatin  20 mg Oral QPM   Continuous Infusions: . sodium chloride 125 mL/hr at 12/03/14 9664       Time spent: 35 minutes    [facility] A  [facility] Pager [phone] If 7PM-7AM, please contact night-coverage at [email or web address], password [email or web address] 12/03/2014, 12:03 PM  LOS: 1 day

## 2014-12-04 DIAGNOSIS — K8500 Idiopathic acute pancreatitis without necrosis or infection: Principal | ICD-10-CM

## 2014-12-04 DIAGNOSIS — D72829 Elevated white blood cell count, unspecified: Secondary | ICD-10-CM

## 2014-12-04 DIAGNOSIS — E785 Hyperlipidemia, unspecified: Secondary | ICD-10-CM

## 2014-12-04 DIAGNOSIS — R6510 Systemic inflammatory response syndrome (SIRS) of non-infectious origin without acute organ dysfunction: Secondary | ICD-10-CM

## 2014-12-04 DIAGNOSIS — I10 Essential (primary) hypertension: Secondary | ICD-10-CM

## 2014-12-04 DIAGNOSIS — R1013 Epigastric pain: Secondary | ICD-10-CM

## 2014-12-04 LAB — COMPREHENSIVE METABOLIC PANEL
ALBUMIN: 3.5 g/dL (ref 3.5–5.0)
ALT: 67 U/L — ABNORMAL HIGH (ref 17–63)
ANION GAP: 7 (ref 5–15)
AST: 22 U/L (ref 15–41)
Alkaline Phosphatase: 55 U/L (ref 38–126)
BILIRUBIN TOTAL: 1.7 mg/dL — AB (ref 0.3–1.2)
BUN: 13 mg/dL (ref 6–20)
CO2: 27 mmol/L (ref 22–32)
Calcium: 8.2 mg/dL — ABNORMAL LOW (ref 8.9–10.3)
Chloride: 103 mmol/L (ref 101–111)
Creatinine, Ser: 0.86 mg/dL (ref 0.61–1.24)
GFR calc Af Amer: >60 mL/min (ref >60)
GFR calc non Af Amer: >60 mL/min (ref >60)
GLUCOSE: 86 mg/dL (ref 65–99)
POTASSIUM: 3.5 mmol/L (ref 3.5–5.1)
SODIUM: 137 mmol/L (ref 135–145)
TOTAL PROTEIN: 5.9 g/dL — AB (ref 6.5–8.1)

## 2014-12-04 LAB — CBC
HCT: 43.1 % (ref 39.0–52.0)
Hemoglobin: 14.7 g/dL (ref 13.0–17.0)
MCH: 30.9 pg (ref 26.0–34.0)
MCHC: 34.1 g/dL (ref 30.0–36.0)
MCV: 90.7 fL (ref 78.0–100.0)
Platelets: 140 10*3/uL — ABNORMAL LOW (ref 150–400)
RBC: 4.75 MIL/uL (ref 4.22–5.81)
RDW: 12.7 % (ref 11.5–15.5)
WBC: 11.1 10*3/uL — ABNORMAL HIGH (ref 4.0–10.5)

## 2014-12-04 LAB — LIPASE, BLOOD: Lipase: 70 U/L — ABNORMAL HIGH (ref 11–51)

## 2014-12-04 MED ORDER — PANTOPRAZOLE SODIUM 40 MG PO TBEC
40.0000 mg | DELAYED_RELEASE_TABLET | Freq: Two times a day (BID) | ORAL | Status: DC
  Administered 2014-12-04 – 2014-12-06 (×5): 40 mg via ORAL
  Filled 2014-12-04 (×4): qty 1

## 2014-12-04 MED ORDER — PANTOPRAZOLE SODIUM 40 MG PO TBEC
DELAYED_RELEASE_TABLET | ORAL | Status: AC
  Filled 2014-12-04: qty 1

## 2014-12-04 NOTE — Progress Notes (Signed)
[facility]                                                                              Patient Demographics  Russell Carney, is a 59 y.o. male, DOB - [birth year], [id number]  Admit date - 12/02/2014   Admitting Physician Verlee Monte, MD  Outpatient Primary MD for the patient is [name], MD  LOS - 2   Chief Complaint  Patient presents with  . Abdominal Pain      HPI on 12/02/2014 by Dr. Verlee Monte Russell Carney is a 59 y.o. male with hospital history of hypertension and dyslipidemia. Patient given to the hospital complaining about epigastric pain. Patient was in his usual state of health until earlier this morning, he will come about 5:30 in the morning to go to work and he felt nauseous, soon this followed by epigastric abdominal pain, dull, 8/10, does not radiate patient felt he is going to throw up but he did not. He went to work and he couldn't tolerate the pains are given to the hospital for further evaluation. In the ED labs showed lipase of >3000, elevated AST/ALT and WBC of 19.6. CT scan showed acute pancreatitis without pseudocyst or necrosis.  Assessment & Plan   Acute pancreatitis with nausea and vomiting -Upon admission, Lipase >3000, lipase currently 70 -CT abd/pelvis: Infiltration of fat about the pancreas consistent with pancreatitis, no pseudocyst or pancreatic necrosis, fatty infiltration of the liver, diverticulosis without diverticulitis -Possibly secondary to idiopathic versus medication induced (HCTZ) -Right quadrant ultrasound: Normal gallbladder, fatty liver -Triglycerides 46 -Patient feels nausea and vomiting have resolved, feels abdominal soreness but would like to try liquids -Will advance patient to clear liquid diet and continue to monitor closely.  Leukocytosis/SIRS  -Upon admission, patient had a WBC of 19.6, respiratory rate of 24 however no true infectious etiology found -Likely secondary to acute pancreatitis -WBC  11.1  Epigastric abdominal pain -Improving, secondary to acute pancreatitis -Continue pain control  Hypertension -Continue losartan, HCTZ held  Transaminitis -Improving, continue to monitor CMP  Code Status: Full  Family Communication: None at bedside  Disposition Plan: Admitted.  Time Spent in minutes   30 minutes  Procedures  RUQ [place]  Consults   None  DVT Prophylaxis  Lovenox  Lab Results  Component Value Date   PLT 140* 12/04/2014    Medications  Scheduled Meds: . antiseptic oral rinse  7 mL Mouth Rinse q12n4p  . aspirin EC  81 mg Oral QODAY  . chlorhexidine  15 mL Mouth Rinse BID  . enoxaparin (LOVENOX) injection  40 mg Subcutaneous Q24H  . losartan  25 mg Oral Daily  . multivitamin with minerals  1 tablet Oral Daily  . pantoprazole      . pantoprazole  40 mg Oral BID  . simvastatin  20 mg Oral QPM   Continuous Infusions: . sodium chloride 100 mL/hr at 12/04/14 0245   PRN Meds:.acetaminophen **OR** acetaminophen, HYDROmorphone (DILAUDID) injection, ondansetron **OR** ondansetron (ZOFRAN) IV, oxyCODONE-acetaminophen, zolpidem  Antibiotics    Anti-infectives    None      Subjective:   Russell Carney seen and examined today. Patient feels that his nausea and vomiting have subsided.  Feels that his pain is better controlled and that he is not had pain medication since yesterday at 2 PM. Patient feels abdominal soreness. Would like to try to drink liquids. Denies a chest pain, shortness of breath.  Objective:   Filed Vitals:   12/03/14 0531 12/03/14 1428 12/03/14 2147 12/04/14 0609  BP: 101/56 139/72 130/74 131/71  Pulse: 73 85 89 82  Temp: 98.3 F (36.8 C) 98.3 F (36.8 C) 98.6 F (37 C) 98.6 F (37 C)  TempSrc: Oral Oral Oral Oral  Resp: 16 18 16    Height:      Weight:      SpO2: 93% 91% 96% 95%    Wt Readings from Last 3 Encounters:  12/02/14 94.5 kg (208 lb 5.4 oz)  07/30/14 99.791 kg (220 lb)  04/24/14 100.33 kg (221 lb 3 oz)      Intake/Output Summary (Last 24 hours) at 12/04/14 1100 Last data filed at 12/04/14 0954  Gross per 24 hour  Intake      0 ml  Output   1175 ml  Net  -1175 ml    Exam  General: Well developed, well nourished, NAD, appears stated age  11: NCAT, mucous membranes moist.   Cardiovascular: S1 S2 auscultated, no rubs, murmurs or gallops. Regular rate and rhythm.  Respiratory: Clear to auscultation bilaterally with equal chest rise  Abdomen: Soft, Mild diffuse TTP, nondistended, + bowel sounds  Extremities: warm dry without cyanosis clubbing or edema  Neuro: AAOx3, nonfocal  Psych: Normal affect and demeanor with intact judgement and insight  Data Review   Micro Results No results found for this or any previous visit (from the past 240 hour(s)).  Radiology Reports Ct Abdomen Pelvis W Contrast  12/02/2014  CLINICAL DATA:  History of diverticulitis. The patient had acute onset of abdominal pain at 5:30 a.m. 12/01/2014. No known injury. Initial encounter. EXAM: CT ABDOMEN AND PELVIS WITH CONTRAST TECHNIQUE: Multidetector CT imaging of the abdomen and pelvis was performed using the standard protocol following bolus administration of intravenous contrast. CONTRAST:  100 mL OMNIPAQUE IOHEXOL 300 MG/ML SOLN, 50 mL OMNIPAQUE IOHEXOL 300 MG/ML SOLN COMPARISON:  CT abdomen and pelvis 07/30/2014. FINDINGS: There is some dependent atelectasis in the lung bases. No pleural or pericardial effusion. There is stranding and a small amount of fluid about the pancreas. The pancreas enhances homogeneously. The splenic and portal veins are patent. No focal fluid collection is identified. Mild fatty infiltration of the liver without focal lesion is seen. The gallbladder, biliary tree, adrenal glands and spleen are unremarkable. A few small bilateral renal cysts are seen. The kidneys are otherwise unremarkable. Urinary bladder, seminal vesicles and prostate gland appear normal. There is some sigmoid  diverticulosis but no diverticulitis is identified. The colon is otherwise unremarkable. The stomach, small bowel and appendix appear normal. No lymphadenopathy is identified. Scattered aortoiliac atherosclerosis without aneurysm is noted. No focal bony abnormality is identified. IMPRESSION: Infiltration of fat about the pancreas is consistent with pancreatitis. No pseudocyst or evidence of pancreatic necrosis is identified. Mild appearing fatty infiltration of the liver. Diverticulosis without diverticulitis. Electronically Signed   By: Inge Rise M.D.   On: 12/02/2014 10:58   Dg Chest Port 1 View  12/02/2014  CLINICAL DATA:  Abdominal pain today.  Initial encounter. EXAM: PORTABLE CHEST 1 VIEW COMPARISON:  Single view of the chest 07/30/2014. FINDINGS: Lung volumes are low but the lungs are clear. Heart size is normal. No pneumothorax or pleural effusion. IMPRESSION: No acute  disease. Electronically Signed   By: Inge Rise M.D.   On: 12/02/2014 09:32   US Abdomen Limited Ruq  12/03/2014  CLINICAL DATA:  Abdomen pain for 2 days EXAM: US ABDOMEN LIMITED - RIGHT UPPER QUADRANT COMPARISON:  CT abdomen pelvis December 02, 2014 FINDINGS: Gallbladder: No gallstones or wall thickening visualized. No sonographic Murphy sign noted. Common bile duct: Diameter: 7.6 mm Liver: No focal lesion identified. There is diffuse increased echotexture of the liver. IMPRESSION: Normal gallbladder. Diffuse increased echotexture of the liver. This can be seen in fatty infiltration of liver. Electronically Signed   By: Abelardo Diesel M.D.   On: 12/03/2014 16:33    CBC  Recent Labs Lab 12/02/14 0850 12/03/14 0420 12/04/14 0348  WBC 19.6* 11.1* 11.1*  HGB 17.1* 15.3 14.7  HCT 49.1 44.3 43.1  PLT 193 153 140*  MCV 90.1 91.2 90.7  MCH 31.4 31.5 30.9  MCHC 34.8 34.5 34.1  RDW 12.9 13.0 12.7    Chemistries   Recent Labs Lab 12/02/14 0850 12/03/14 0420 12/04/14 0348  NA 138 138 137  K 4.3 3.6 3.5  CL  102 102 103  CO2 25 29 27   GLUCOSE 133* 100* 86  BUN 18 15 13   CREATININE 0.94 0.89 0.86  CALCIUM 9.3 8.7* 8.2*  AST 216* 53* 22  ALT 167* 119* 67*  ALKPHOS 77 68 55  BILITOT 1.4* 1.4* 1.7*   ------------------------------------------------------------------------------------------------------------------ estimated creatinine clearance is 103.6 mL/min (by C-G formula based on Cr of 0.86). ------------------------------------------------------------------------------------------------------------------  Recent Labs  12/02/14 0850  HGBA1C 5.6   ------------------------------------------------------------------------------------------------------------------  Recent Labs  12/02/14 1239  CHOL 159  HDL 41  LDLCALC 109*  TRIG 46  CHOLHDL 3.9   ------------------------------------------------------------------------------------------------------------------  Recent Labs  12/02/14 1621  TSH 1.173   ------------------------------------------------------------------------------------------------------------------ No results for input(s): VITAMINB12, FOLATE, FERRITIN, TIBC, IRON, RETICCTPCT in the last 72 hours.  Coagulation profile  Recent Labs Lab 12/02/14 1621  INR 1.11    No results for input(s): DDIMER in the last 72 hours.  Cardiac Enzymes  Recent Labs Lab 12/02/14 0850  TROPONINI <0.03   ------------------------------------------------------------------------------------------------------------------ Invalid input(s): POCBNP    Camaryn Lumbert D.O. on 12/04/2014 at 11:00 AM  Between 7am to 7pm - Pager - [phone]  After 7pm go to [email or web address] - password [email or web address]  And look for the night coverage person covering for me after hours  [facility] Office  [phone]

## 2014-12-04 NOTE — Care Management Note (Signed)
Case Management Note  Patient Details  Name: Russell Carney MRN: [id number] Date of Birth: 15-Oct-1955  Subjective/Objective:          59 yo admitted with Idiopathic acute pancreatitis          Action/Plan: From home with spouse  Expected Discharge Date:   (UNKNOWN)               Expected Discharge Plan:  Home/Self Care  In-House Referral:     Discharge planning Services  CM Consult  Post Acute Care Choice:    Choice offered to:     DME Arranged:    DME Agency:     HH Arranged:    HH Agency:     Status of Service:  In process, will continue to follow  Medicare Important Message Given:    Date Medicare IM Given:    Medicare IM give by:    Date Additional Medicare IM Given:    Additional Medicare Important Message give by:     If discussed at [place] of Stay Meetings, dates discussed:    Additional Comments: Chart reviewed and CM following for DC needs. Lynnell Catalan, RN 12/04/2014, 2:14 PM

## 2014-12-05 LAB — COMPREHENSIVE METABOLIC PANEL
ALBUMIN: 3.5 g/dL (ref 3.5–5.0)
ALK PHOS: 50 U/L (ref 38–126)
ALT: 46 U/L (ref 17–63)
ANION GAP: 6 (ref 5–15)
AST: 14 U/L — ABNORMAL LOW (ref 15–41)
BUN: 10 mg/dL (ref 6–20)
CALCIUM: 8.4 mg/dL — AB (ref 8.9–10.3)
CHLORIDE: 105 mmol/L (ref 101–111)
CO2: 27 mmol/L (ref 22–32)
Creatinine, Ser: 0.78 mg/dL (ref 0.61–1.24)
GFR calc non Af Amer: >60 mL/min (ref >60)
GLUCOSE: 95 mg/dL (ref 65–99)
Potassium: 3.8 mmol/L (ref 3.5–5.1)
SODIUM: 138 mmol/L (ref 135–145)
Total Bilirubin: 1.3 mg/dL — ABNORMAL HIGH (ref 0.3–1.2)
Total Protein: 6.1 g/dL — ABNORMAL LOW (ref 6.5–8.1)

## 2014-12-05 LAB — CBC
HCT: 41.1 % (ref 39.0–52.0)
Hemoglobin: 14 g/dL (ref 13.0–17.0)
MCH: 30.9 pg (ref 26.0–34.0)
MCHC: 34.1 g/dL (ref 30.0–36.0)
MCV: 90.7 fL (ref 78.0–100.0)
PLATELETS: 125 10*3/uL — AB (ref 150–400)
RBC: 4.53 MIL/uL (ref 4.22–5.81)
RDW: 12.7 % (ref 11.5–15.5)
WBC: 8.2 10*3/uL (ref 4.0–10.5)

## 2014-12-05 NOTE — Progress Notes (Signed)
[facility]                                                                              Patient Demographics  Russell Carney, is a 59 y.o. male, DOB - 03-Aug-1955, [id number]  Admit date - 12/02/2014   Admitting Physician Verlee Monte, MD  Outpatient Primary MD for the patient is [name], MD  LOS - 3   Chief Complaint  Patient presents with  . Abdominal Pain      HPI on 12/02/2014 by Dr. Verlee Monte Russell Carney is a 59 y.o. male with hospital history of hypertension and dyslipidemia. Patient given to the hospital complaining about epigastric pain. Patient was in his usual state of health until earlier this morning, he will come about 5:30 in the morning to go to work and he felt nauseous, soon this followed by epigastric abdominal pain, dull, 8/10, does not radiate patient felt he is going to throw up but he did not. He went to work and he couldn't tolerate the pains are given to the hospital for further evaluation. In the ED labs showed lipase of >3000, elevated AST/ALT and WBC of 19.6. CT scan showed acute pancreatitis without pseudocyst or necrosis.  Assessment & Plan   Acute pancreatitis with nausea and vomiting -Upon admission, Lipase >3000, lipase currently 70 -CT abd/pelvis: Infiltration of fat about the pancreas consistent with pancreatitis, no pseudocyst or pancreatic necrosis, fatty infiltration of the liver, diverticulosis without diverticulitis -Possibly secondary to idiopathic versus medication induced (HCTZ) -Right quadrant ultrasound: Normal gallbladder, fatty liver -Triglycerides 46 -Patient feels nausea and vomiting have resolved, feels abdominal soreness but would like to try liquids -Will continue to advance diet slowly as tolerated  Leukocytosis/SIRS  -Upon admission, patient had a WBC of 19.6, respiratory rate of 24 however no true infectious etiology found -Likely secondary to acute pancreatitis -WBC 8.2  Epigastric abdominal  pain -Improving, secondary to acute pancreatitis -Continue pain control  Hypertension -Continue losartan, HCTZ held  Transaminitis -Improving, continue to monitor CMP  Code Status: Full  Family Communication: None at bedside  Disposition Plan: Admitted.  Continue to advance as tolerated and monitor.  Likely discharge in 24 hours.  Time Spent in minutes   30 minutes  Procedures  RUQ [place]  Consults   None  DVT Prophylaxis  Lovenox  Lab Results  Component Value Date   PLT 125* 12/05/2014    Medications  Scheduled Meds: . antiseptic oral rinse  7 mL Mouth Rinse q12n4p  . aspirin EC  81 mg Oral QODAY  . chlorhexidine  15 mL Mouth Rinse BID  . enoxaparin (LOVENOX) injection  40 mg Subcutaneous Q24H  . losartan  25 mg Oral Daily  . multivitamin with minerals  1 tablet Oral Daily  . pantoprazole  40 mg Oral BID  . simvastatin  20 mg Oral QPM   Continuous Infusions:   PRN Meds:.acetaminophen **OR** acetaminophen, HYDROmorphone (DILAUDID) injection, ondansetron **OR** ondansetron (ZOFRAN) IV, oxyCODONE-acetaminophen, zolpidem  Antibiotics    Anti-infectives    None      Subjective:   Russell Carney seen and examined today. Patient feels his is improving and "headed in the right direction".  Denies abdominal  pain, nausea or vomiting.  Was able to tolerate his diet yeseterday.  Has not had a bowel movement but had had flatus.  Denies chest pain, shortness of breath.    Objective:   Filed Vitals:   12/04/14 0609 12/04/14 1323 12/04/14 2212 12/05/14 0634  BP: 131/71 127/75 137/72 132/71  Pulse: 82 78 78 68  Temp: 98.6 F (37 C) 98.1 F (36.7 C) 99.2 F (37.3 C) 98.4 F (36.9 C)  TempSrc: Oral Oral Oral Oral  Resp:  18 18 18   Height:      Weight:      SpO2: 95% 96% 96% 98%    Wt Readings from Last 3 Encounters:  12/02/14 94.5 kg (208 lb 5.4 oz)  07/30/14 99.791 kg (220 lb)  04/24/14 100.33 kg (221 lb 3 oz)     Intake/Output Summary (Last 24 hours) at  12/05/14 1115 Last data filed at 12/05/14 0946  Gross per 24 hour  Intake    240 ml  Output      0 ml  Net    240 ml    Exam  General: Well developed, well nourished, NAD  HEENT: NCAT, mucous membranes moist.   Cardiovascular: S1 S2 auscultated, RRR, no murmurs  Respiratory: Clear to auscultation bilaterally with equal chest rise  Abdomen: Soft, Mild TTP, nondistended, + bowel sounds  Extremities: warm dry without cyanosis clubbing or edema  Neuro: AAOx3, nonfocal  Psych: Normal affect and demeanor   Data Review   Micro Results No results found for this or any previous visit (from the past 240 hour(s)).  Radiology Reports Ct Abdomen Pelvis W Contrast  12/02/2014  CLINICAL DATA:  History of diverticulitis. The patient had acute onset of abdominal pain at 5:30 a.m. 12/01/2014. No known injury. Initial encounter. EXAM: CT ABDOMEN AND PELVIS WITH CONTRAST TECHNIQUE: Multidetector CT imaging of the abdomen and pelvis was performed using the standard protocol following bolus administration of intravenous contrast. CONTRAST:  100 mL OMNIPAQUE IOHEXOL 300 MG/ML SOLN, 50 mL OMNIPAQUE IOHEXOL 300 MG/ML SOLN COMPARISON:  CT abdomen and pelvis 07/30/2014. FINDINGS: There is some dependent atelectasis in the lung bases. No pleural or pericardial effusion. There is stranding and a small amount of fluid about the pancreas. The pancreas enhances homogeneously. The splenic and portal veins are patent. No focal fluid collection is identified. Mild fatty infiltration of the liver without focal lesion is seen. The gallbladder, biliary tree, adrenal glands and spleen are unremarkable. A few small bilateral renal cysts are seen. The kidneys are otherwise unremarkable. Urinary bladder, seminal vesicles and prostate gland appear normal. There is some sigmoid diverticulosis but no diverticulitis is identified. The colon is otherwise unremarkable. The stomach, small bowel and appendix appear normal. No  lymphadenopathy is identified. Scattered aortoiliac atherosclerosis without aneurysm is noted. No focal bony abnormality is identified. IMPRESSION: Infiltration of fat about the pancreas is consistent with pancreatitis. No pseudocyst or evidence of pancreatic necrosis is identified. Mild appearing fatty infiltration of the liver. Diverticulosis without diverticulitis. Electronically Signed   By: Inge Rise M.D.   On: 12/02/2014 10:58   Dg Chest Port 1 View  12/02/2014  CLINICAL DATA:  Abdominal pain today.  Initial encounter. EXAM: PORTABLE CHEST 1 VIEW COMPARISON:  Single view of the chest 07/30/2014. FINDINGS: Lung volumes are low but the lungs are clear. Heart size is normal. No pneumothorax or pleural effusion. IMPRESSION: No acute disease. Electronically Signed   By: Inge Rise M.D.   On: 12/02/2014 09:32  US Abdomen Limited Ruq  12/03/2014  CLINICAL DATA:  Abdomen pain for 2 days EXAM: US ABDOMEN LIMITED - RIGHT UPPER QUADRANT COMPARISON:  CT abdomen pelvis December 02, 2014 FINDINGS: Gallbladder: No gallstones or wall thickening visualized. No sonographic Murphy sign noted. Common bile duct: Diameter: 7.6 mm Liver: No focal lesion identified. There is diffuse increased echotexture of the liver. IMPRESSION: Normal gallbladder. Diffuse increased echotexture of the liver. This can be seen in fatty infiltration of liver. Electronically Signed   By: Abelardo Diesel M.D.   On: 12/03/2014 16:33    CBC  Recent Labs Lab 12/02/14 0850 12/03/14 0420 12/04/14 0348 12/05/14 0405  WBC 19.6* 11.1* 11.1* 8.2  HGB 17.1* 15.3 14.7 14.0  HCT 49.1 44.3 43.1 41.1  PLT 193 153 140* 125*  MCV 90.1 91.2 90.7 90.7  MCH 31.4 31.5 30.9 30.9  MCHC 34.8 34.5 34.1 34.1  RDW 12.9 13.0 12.7 12.7    Chemistries   Recent Labs Lab 12/02/14 0850 12/03/14 0420 12/04/14 0348 12/05/14 0405  NA 138 138 137 138  K 4.3 3.6 3.5 3.8  CL 102 102 103 105  CO2 25 29 27 27   GLUCOSE 133* 100* 86 95  BUN  18 15 13 10   CREATININE 0.94 0.89 0.86 0.78  CALCIUM 9.3 8.7* 8.2* 8.4*  AST 216* 53* 22 14*  ALT 167* 119* 67* 46  ALKPHOS 77 68 55 50  BILITOT 1.4* 1.4* 1.7* 1.3*   ------------------------------------------------------------------------------------------------------------------ estimated creatinine clearance is 111.4 mL/min (by C-G formula based on Cr of 0.78). ------------------------------------------------------------------------------------------------------------------ No results for input(s): HGBA1C in the last 72 hours. ------------------------------------------------------------------------------------------------------------------  Recent Labs  12/02/14 1239  CHOL 159  HDL 41  LDLCALC 109*  TRIG 46  CHOLHDL 3.9   ------------------------------------------------------------------------------------------------------------------  Recent Labs  12/02/14 1621  TSH 1.173   ------------------------------------------------------------------------------------------------------------------ No results for input(s): VITAMINB12, FOLATE, FERRITIN, TIBC, IRON, RETICCTPCT in the last 72 hours.  Coagulation profile  Recent Labs Lab 12/02/14 1621  INR 1.11    No results for input(s): DDIMER in the last 72 hours.  Cardiac Enzymes  Recent Labs Lab 12/02/14 0850  TROPONINI <0.03   ------------------------------------------------------------------------------------------------------------------ Invalid input(s): POCBNP    Xzavion Doswell D.O. on 12/05/2014 at 11:15 AM  Between 7am to 7pm - Pager - [phone]  After 7pm go to [email or web address] - password [email or web address]  And look for the night coverage person covering for me after hours  [facility] Office  [phone]

## 2014-12-06 LAB — COMPREHENSIVE METABOLIC PANEL
ALK PHOS: 46 U/L (ref 38–126)
ALT: 36 U/L (ref 17–63)
AST: 12 U/L — AB (ref 15–41)
Albumin: 3.3 g/dL — ABNORMAL LOW (ref 3.5–5.0)
Anion gap: 10 (ref 5–15)
BUN: 12 mg/dL (ref 6–20)
CALCIUM: 8.6 mg/dL — AB (ref 8.9–10.3)
CHLORIDE: 107 mmol/L (ref 101–111)
CO2: 23 mmol/L (ref 22–32)
CREATININE: 0.67 mg/dL (ref 0.61–1.24)
GFR calc Af Amer: >60 mL/min (ref >60)
GFR calc non Af Amer: >60 mL/min (ref >60)
Glucose, Bld: 88 mg/dL (ref 65–99)
Potassium: 3.8 mmol/L (ref 3.5–5.1)
SODIUM: 140 mmol/L (ref 135–145)
Total Bilirubin: 1 mg/dL (ref 0.3–1.2)
Total Protein: 6.1 g/dL — ABNORMAL LOW (ref 6.5–8.1)

## 2014-12-06 LAB — CBC
HCT: 41 % (ref 39.0–52.0)
HEMOGLOBIN: 14.2 g/dL (ref 13.0–17.0)
MCH: 31.1 pg (ref 26.0–34.0)
MCHC: 34.6 g/dL (ref 30.0–36.0)
MCV: 89.7 fL (ref 78.0–100.0)
PLATELETS: 153 10*3/uL (ref 150–400)
RBC: 4.57 MIL/uL (ref 4.22–5.81)
RDW: 12.8 % (ref 11.5–15.5)
WBC: 7.6 10*3/uL (ref 4.0–10.5)

## 2014-12-06 NOTE — Discharge Summary (Signed)
Physician Discharge Summary  Russell Carney [id number] DOB: [birth year] DOA: 12/02/2014  PCP: [name], MD  Admit date: 12/02/2014 Discharge date: 12/06/2014  Time spent: 45 minutes  Recommendations for Outpatient Follow-up:  Patient will be discharged to home.  Patient will need to follow up with primary care provider within one week of discharge.  Patient should continue medications as prescribed.  Patient should follow a heart healthy diet.   Discharge Diagnoses:  Finger to his nausea and vomiting Leukocytosis/stairs Epigastric abdominal pain Hypertension Transaminitis  Discharge Condition: Stable  Diet recommendation: Heart healthy  Filed Weights   12/02/14 1521  Weight: 94.5 kg (208 lb 5.4 oz)    History of present illness:  on 12/02/2014 by Dr. Verlee Monte DANIELL Russell Carney is a 59 y.o. male with hospital history of hypertension and dyslipidemia. Patient given to the hospital complaining about epigastric pain. Patient was in his usual state of health until earlier this morning, he will come about 5:30 in the morning to go to work and he felt nauseous, soon this followed by epigastric abdominal pain, dull, 8/10, does not radiate patient felt he is going to throw up but he did not. He went to work and he couldn't tolerate the pains are given to the hospital for further evaluation. In the ED labs showed lipase of >3000, elevated AST/ALT and WBC of 19.6. CT scan showed acute pancreatitis without pseudocyst or necrosis.  Hospital Course:  Acute pancreatitis with nausea and vomiting -Upon admission, Lipase >3000, lipase currently 70 -CT abd/pelvis: Infiltration of fat about the pancreas consistent with pancreatitis, no pseudocyst or pancreatic necrosis, fatty infiltration of the liver, diverticulosis without diverticulitis -Possibly secondary to idiopathic versus medication induced (HCTZ) -Right quadrant ultrasound: Normal gallbladder, fatty liver -Triglycerides  46 -Patient tolerated soft diet  Leukocytosis/SIRS  -Upon admission, patient had a WBC of 19.6, respiratory rate of 24 however no true infectious etiology found -Likely secondary to acute pancreatitis -WBC 7.6  Epigastric abdominal pain -Improving, secondary to acute pancreatitis -Continue pain control  Hypertension -Continue losartan, HCTZ held  Transaminitis -Resolved  Procedures  RUQ [place]  Consults  None  Discharge Exam: Filed Vitals:   12/06/14 0554  BP: 117/67  Pulse: 90  Temp: 98.3 F (36.8 C)  Resp: 18   Exam  General: Well developed, well nourished, NAD  HEENT: NCAT, mucous membranes moist.   Cardiovascular: S1 S2 auscultated, RRR, no murmurs  Respiratory: Clear to auscultation bilaterally with equal chest rise  Abdomen: Soft, nontender, nondistended, + bowel sounds  Extremities: warm dry without cyanosis clubbing or edema  Neuro: AAOx3, nonfocal  Psych: Normal affect and demeanor, pleasant  Discharge Instructions      Discharge Instructions    Discharge instructions    Complete by:  As directed   Patient will be discharged to home.  Patient will need to follow up with primary care provider within one week of discharge.  Patient should continue medications as prescribed.  Patient should follow a heart healthy diet.            Medication List    STOP taking these medications        hydrochlorothiazide 25 MG tablet  Commonly known as:  HYDRODIURIL     metoCLOPramide 10 MG tablet  Commonly known as:  REGLAN     omeprazole 20 MG capsule  Commonly known as:  PRILOSEC     oxyCODONE-acetaminophen 5-325 MG tablet  Commonly known as:  PERCOCET/ROXICET      TAKE these medications  aspirin EC 81 MG tablet  Take 81 mg by mouth every other day.     celecoxib 200 MG capsule  Commonly known as:  CELEBREX  Take 200 mg by mouth daily.     cholecalciferol 1000 UNITS tablet  Commonly known as:  VITAMIN D  Take 1,000 Units by  mouth daily.     CoQ10 100 MG Caps  Take 1 capsule by mouth daily.     folic acid [id number] MCG tablet  Commonly known as:  FOLVITE  Take 400 mcg by mouth daily.     GLUCOSAMINE 1500 COMPLEX Caps  Take 1 capsule by mouth daily.     ibuprofen 600 MG tablet  Commonly known as:  ADVIL,MOTRIN  Take 600 mg by mouth every 6 (six) hours as needed for fever, headache, mild pain, moderate pain or cramping.     losartan 25 MG tablet  Commonly known as:  COZAAR  Take 25 mg by mouth daily.     multivitamin with minerals Tabs tablet  Take 1 tablet by mouth daily.     Olive Leaf Extract 500 MG Caps  Take 1 capsule by mouth daily.     PROBIOTIC DAILY Caps  Take 1 capsule by mouth daily.     simvastatin 20 MG tablet  Commonly known as:  ZOCOR  Take 20 mg by mouth every evening.     zolpidem 10 MG tablet  Commonly known as:  AMBIEN  Take 10 mg by mouth at bedtime as needed for sleep.       Allergies  Allergen Reactions  . Lisinopril Cough   Follow-up Information    Follow up with [name], MD. Schedule an appointment as soon as possible for a visit in 1 week.   Specialty:  Family Medicine   Why:  Hospital followup   Contact information:   [place] [address] [phone]        The results of significant diagnostics from this hospitalization (including imaging, microbiology, ancillary and laboratory) are listed below for reference.    Significant Diagnostic Studies: Ct Abdomen Pelvis W Contrast  12/02/2014  CLINICAL DATA:  History of diverticulitis. The patient had acute onset of abdominal pain at 5:30 a.m. 12/01/2014. No known injury. Initial encounter. EXAM: CT ABDOMEN AND PELVIS WITH CONTRAST TECHNIQUE: Multidetector CT imaging of the abdomen and pelvis was performed using the standard protocol following bolus administration of intravenous contrast. CONTRAST:  100 mL OMNIPAQUE IOHEXOL 300 MG/ML SOLN, 50 mL OMNIPAQUE IOHEXOL 300 MG/ML  SOLN COMPARISON:  CT abdomen and pelvis 07/30/2014. FINDINGS: There is some dependent atelectasis in the lung bases. No pleural or pericardial effusion. There is stranding and a small amount of fluid about the pancreas. The pancreas enhances homogeneously. The splenic and portal veins are patent. No focal fluid collection is identified. Mild fatty infiltration of the liver without focal lesion is seen. The gallbladder, biliary tree, adrenal glands and spleen are unremarkable. A few small bilateral renal cysts are seen. The kidneys are otherwise unremarkable. Urinary bladder, seminal vesicles and prostate gland appear normal. There is some sigmoid diverticulosis but no diverticulitis is identified. The colon is otherwise unremarkable. The stomach, small bowel and appendix appear normal. No lymphadenopathy is identified. Scattered aortoiliac atherosclerosis without aneurysm is noted. No focal bony abnormality is identified. IMPRESSION: Infiltration of fat about the pancreas is consistent with pancreatitis. No pseudocyst or evidence of pancreatic necrosis is identified. Mild appearing fatty infiltration of the liver. Diverticulosis without diverticulitis.  Electronically Signed   By: Inge Rise M.D.   On: 12/02/2014 10:58   Dg Chest Port 1 View  12/02/2014  CLINICAL DATA:  Abdominal pain today.  Initial encounter. EXAM: PORTABLE CHEST 1 VIEW COMPARISON:  Single view of the chest 07/30/2014. FINDINGS: Lung volumes are low but the lungs are clear. Heart size is normal. No pneumothorax or pleural effusion. IMPRESSION: No acute disease. Electronically Signed   By: Inge Rise M.D.   On: 12/02/2014 09:32   US Abdomen Limited Ruq  12/03/2014  CLINICAL DATA:  Abdomen pain for 2 days EXAM: US ABDOMEN LIMITED - RIGHT UPPER QUADRANT COMPARISON:  CT abdomen pelvis December 02, 2014 FINDINGS: Gallbladder: No gallstones or wall thickening visualized. No sonographic Murphy sign noted. Common bile duct: Diameter:  7.6 mm Liver: No focal lesion identified. There is diffuse increased echotexture of the liver. IMPRESSION: Normal gallbladder. Diffuse increased echotexture of the liver. This can be seen in fatty infiltration of liver. Electronically Signed   By: Abelardo Diesel M.D.   On: 12/03/2014 16:33    Microbiology: No results found for this or any previous visit (from the past 240 hour(s)).   Labs: Basic Metabolic Panel:  Recent Labs Lab 12/02/14 0850 12/03/14 0420 12/04/14 0348 12/05/14 0405 12/06/14 0530  NA 138 138 137 138 140  K 4.3 3.6 3.5 3.8 3.8  CL 102 102 103 105 107  CO2 25 29 27 27 23   GLUCOSE 133* 100* 86 95 88  BUN 18 15 13 10 12   CREATININE 0.94 0.89 0.86 0.78 0.67  CALCIUM 9.3 8.7* 8.2* 8.4* 8.6*   Liver Function Tests:  Recent Labs Lab 12/02/14 0850 12/03/14 0420 12/04/14 0348 12/05/14 0405 12/06/14 0530  AST 216* 53* 22 14* 12*  ALT 167* 119* 67* 46 36  ALKPHOS 77 68 55 50 46  BILITOT 1.4* 1.4* 1.7* 1.3* 1.0  PROT 7.1 6.3* 5.9* 6.1* 6.1*  ALBUMIN 4.4 3.9 3.5 3.5 3.3*    Recent Labs Lab 12/02/14 0850 12/04/14 0348  LIPASE >3000* 70*   No results for input(s): AMMONIA in the last 168 hours. CBC:  Recent Labs Lab 12/02/14 0850 12/03/14 0420 12/04/14 0348 12/05/14 0405 12/06/14 0530  WBC 19.6* 11.1* 11.1* 8.2 7.6  HGB 17.1* 15.3 14.7 14.0 14.2  HCT 49.1 44.3 43.1 41.1 41.0  MCV 90.1 91.2 90.7 90.7 89.7  PLT 193 153 140* 125* 153   Cardiac Enzymes:  Recent Labs Lab 12/02/14 0850  TROPONINI <0.03   BNP: BNP (last 3 results) No results for input(s): BNP in the last 8760 hours.  ProBNP (last 3 results) No results for input(s): PROBNP in the last 8760 hours.  CBG: No results for input(s): GLUCAP in the last 168 hours.     SignedCristal Ford  [facility] 12/06/2014, 9:59 AM

## 2014-12-06 NOTE — Progress Notes (Signed)
Pt discharged home. Medication regimen understood.  

## 2014-12-06 NOTE — Discharge Instructions (Signed)

## 2015-07-27 ENCOUNTER — Inpatient Hospital Stay (HOSPITAL_COMMUNITY)
Admission: EM | Admit: 2015-07-27 | Discharge: 2015-07-30 | DRG: 440 | Disposition: A | Discharge status: Home / Self Care | Payer: Commercial Managed Care - HMO | Attending: Internal Medicine | Admitting: Internal Medicine

## 2015-07-27 ENCOUNTER — Encounter (HOSPITAL_COMMUNITY): Payer: Self-pay

## 2015-07-27 DIAGNOSIS — E785 Hyperlipidemia, unspecified: Secondary | ICD-10-CM | POA: Diagnosis present

## 2015-07-27 DIAGNOSIS — Z7982 Long term (current) use of aspirin: Secondary | ICD-10-CM

## 2015-07-27 DIAGNOSIS — Z800 Family history of malignant neoplasm of digestive organs: Secondary | ICD-10-CM

## 2015-07-27 DIAGNOSIS — G4733 Obstructive sleep apnea (adult) (pediatric): Secondary | ICD-10-CM | POA: Diagnosis present

## 2015-07-27 DIAGNOSIS — Z8249 Family history of ischemic heart disease and other diseases of the circulatory system: Secondary | ICD-10-CM

## 2015-07-27 DIAGNOSIS — I10 Essential (primary) hypertension: Secondary | ICD-10-CM | POA: Diagnosis present

## 2015-07-27 DIAGNOSIS — T466X5A Adverse effect of antihyperlipidemic and antiarteriosclerotic drugs, initial encounter: Secondary | ICD-10-CM | POA: Diagnosis present

## 2015-07-27 DIAGNOSIS — K859 Acute pancreatitis without necrosis or infection, unspecified: Principal | ICD-10-CM | POA: Diagnosis present

## 2015-07-27 HISTORY — DX: Acute pancreatitis without necrosis or infection, unspecified: K85.90

## 2015-07-27 HISTORY — DX: Diverticulosis of intestine, part unspecified, without perforation or abscess without bleeding: K57.90

## 2015-07-27 NOTE — ED Notes (Signed)
Patient complains of mid upper abdomen that started at 2030 this evening after eating. Complains of nausea. Hx of pancreatitis. States this feels like the same pain.

## 2015-07-28 ENCOUNTER — Encounter (HOSPITAL_COMMUNITY): Payer: Self-pay | Admitting: Internal Medicine

## 2015-07-28 ENCOUNTER — Emergency Department (HOSPITAL_COMMUNITY): Payer: Commercial Managed Care - HMO

## 2015-07-28 DIAGNOSIS — K859 Acute pancreatitis without necrosis or infection, unspecified: Secondary | ICD-10-CM | POA: Diagnosis present

## 2015-07-28 DIAGNOSIS — K8500 Idiopathic acute pancreatitis without necrosis or infection: Secondary | ICD-10-CM | POA: Diagnosis not present

## 2015-07-28 DIAGNOSIS — I10 Essential (primary) hypertension: Secondary | ICD-10-CM | POA: Diagnosis present

## 2015-07-28 DIAGNOSIS — G4733 Obstructive sleep apnea (adult) (pediatric): Secondary | ICD-10-CM | POA: Diagnosis present

## 2015-07-28 DIAGNOSIS — T466X5A Adverse effect of antihyperlipidemic and antiarteriosclerotic drugs, initial encounter: Secondary | ICD-10-CM | POA: Diagnosis present

## 2015-07-28 DIAGNOSIS — E785 Hyperlipidemia, unspecified: Secondary | ICD-10-CM

## 2015-07-28 DIAGNOSIS — Z800 Family history of malignant neoplasm of digestive organs: Secondary | ICD-10-CM | POA: Diagnosis not present

## 2015-07-28 DIAGNOSIS — K858 Other acute pancreatitis without necrosis or infection: Secondary | ICD-10-CM | POA: Diagnosis not present

## 2015-07-28 DIAGNOSIS — Z8249 Family history of ischemic heart disease and other diseases of the circulatory system: Secondary | ICD-10-CM | POA: Diagnosis not present

## 2015-07-28 DIAGNOSIS — Z7982 Long term (current) use of aspirin: Secondary | ICD-10-CM | POA: Diagnosis not present

## 2015-07-28 LAB — COMPREHENSIVE METABOLIC PANEL
ALBUMIN: 4 g/dL (ref 3.5–5.0)
ALBUMIN: 4.6 g/dL (ref 3.5–5.0)
ALK PHOS: 75 U/L (ref 38–126)
ALT: 82 U/L — ABNORMAL HIGH (ref 17–63)
ALT: 89 U/L — AB (ref 17–63)
ANION GAP: 5 (ref 5–15)
ANION GAP: 9 (ref 5–15)
AST: 108 U/L — AB (ref 15–41)
AST: 62 U/L — ABNORMAL HIGH (ref 15–41)
Alkaline Phosphatase: 65 U/L (ref 38–126)
BILIRUBIN TOTAL: 1.3 mg/dL — AB (ref 0.3–1.2)
BUN: 15 mg/dL (ref 6–20)
BUN: 17 mg/dL (ref 6–20)
CALCIUM: 8.7 mg/dL — AB (ref 8.9–10.3)
CHLORIDE: 105 mmol/L (ref 101–111)
CO2: 26 mmol/L (ref 22–32)
CO2: 27 mmol/L (ref 22–32)
CREATININE: 1.11 mg/dL (ref 0.61–1.24)
Calcium: 8.2 mg/dL — ABNORMAL LOW (ref 8.9–10.3)
Chloride: 102 mmol/L (ref 101–111)
Creatinine, Ser: 1.01 mg/dL (ref 0.61–1.24)
GFR calc Af Amer: >60 mL/min (ref >60)
GFR calc non Af Amer: >60 mL/min (ref >60)
GFR calc non Af Amer: >60 mL/min (ref >60)
GLUCOSE: 114 mg/dL — AB (ref 65–99)
GLUCOSE: 156 mg/dL — AB (ref 65–99)
POTASSIUM: 4.9 mmol/L (ref 3.5–5.1)
Potassium: 3.9 mmol/L (ref 3.5–5.1)
SODIUM: 137 mmol/L (ref 135–145)
SODIUM: 137 mmol/L (ref 135–145)
TOTAL PROTEIN: 7.2 g/dL (ref 6.5–8.1)
Total Bilirubin: 0.8 mg/dL (ref 0.3–1.2)
Total Protein: 6.2 g/dL — ABNORMAL LOW (ref 6.5–8.1)

## 2015-07-28 LAB — CBC
HCT: 47.6 % (ref 39.0–52.0)
HEMATOCRIT: 43.1 % (ref 39.0–52.0)
HEMOGLOBIN: 14.8 g/dL (ref 13.0–17.0)
HEMOGLOBIN: 16.6 g/dL (ref 13.0–17.0)
MCH: 30.8 pg (ref 26.0–34.0)
MCH: 31.1 pg (ref 26.0–34.0)
MCHC: 34.3 g/dL (ref 30.0–36.0)
MCHC: 34.9 g/dL (ref 30.0–36.0)
MCV: 89.1 fL (ref 78.0–100.0)
MCV: 89.8 fL (ref 78.0–100.0)
Platelets: 162 10*3/uL (ref 150–400)
Platelets: 188 10*3/uL (ref 150–400)
RBC: 4.8 MIL/uL (ref 4.22–5.81)
RBC: 5.34 MIL/uL (ref 4.22–5.81)
RDW: 13.1 % (ref 11.5–15.5)
RDW: 13.2 % (ref 11.5–15.5)
WBC: 11.5 10*3/uL — ABNORMAL HIGH (ref 4.0–10.5)
WBC: 8.6 10*3/uL (ref 4.0–10.5)

## 2015-07-28 LAB — URINALYSIS, ROUTINE W REFLEX MICROSCOPIC
BILIRUBIN URINE: NEGATIVE
Glucose, UA: NEGATIVE mg/dL
KETONES UR: NEGATIVE mg/dL
Leukocytes, UA: NEGATIVE
NITRITE: NEGATIVE
Protein, ur: NEGATIVE mg/dL
SPECIFIC GRAVITY, URINE: 1.02 (ref 1.005–1.030)
pH: 5 (ref 5.0–8.0)

## 2015-07-28 LAB — URINE MICROSCOPIC-ADD ON

## 2015-07-28 LAB — LIPID PANEL
CHOL/HDL RATIO: 4.1 ratio
Cholesterol: 144 mg/dL (ref 0–200)
HDL: 35 mg/dL — AB (ref >40)
LDL Cholesterol: 95 mg/dL (ref 0–99)
Triglycerides: 68 mg/dL (ref <150)
VLDL: 14 mg/dL (ref 0–40)

## 2015-07-28 LAB — LIPASE, BLOOD
Lipase: 4163 U/L — ABNORMAL HIGH (ref 11–51)
Lipase: 1277 U/L — ABNORMAL HIGH (ref 11–51)

## 2015-07-28 MED ORDER — SODIUM CHLORIDE 0.9 % IV BOLUS (SEPSIS)
500.0000 mL | Freq: Once | INTRAVENOUS | Status: AC
  Administered 2015-07-28: 500 mL via INTRAVENOUS

## 2015-07-28 MED ORDER — POTASSIUM CHLORIDE IN NACL 20-0.9 MEQ/L-% IV SOLN: INTRAVENOUS | Status: DC

## 2015-07-28 MED ORDER — ONDANSETRON HCL 4 MG PO TABS: 4.0000 mg | ORAL_TABLET | Freq: Four times a day (QID) | ORAL | Status: DC | PRN

## 2015-07-28 MED ORDER — SODIUM CHLORIDE 0.9 % IV SOLN
INTRAVENOUS | Status: DC
  Administered 2015-07-28 – 2015-07-30 (×5): via INTRAVENOUS

## 2015-07-28 MED ORDER — ONDANSETRON HCL 4 MG/2ML IJ SOLN
INTRAMUSCULAR | Status: AC
  Filled 2015-07-28: qty 2

## 2015-07-28 MED ORDER — ZOLPIDEM TARTRATE 5 MG PO TABS
10.0000 mg | ORAL_TABLET | Freq: Every evening | ORAL | Status: DC | PRN
  Administered 2015-07-28 – 2015-07-29 (×2): 10 mg via ORAL
  Filled 2015-07-28 (×2): qty 2

## 2015-07-28 MED ORDER — ENOXAPARIN SODIUM 40 MG/0.4ML ~~LOC~~ SOLN
40.0000 mg | SUBCUTANEOUS | Status: DC
  Administered 2015-07-29 – 2015-07-30 (×2): 40 mg via SUBCUTANEOUS
  Filled 2015-07-28 (×2): qty 0.4

## 2015-07-28 MED ORDER — SODIUM CHLORIDE 0.9 % IV SOLN
INTRAVENOUS | Status: DC
  Administered 2015-07-28: 10:00:00 via INTRAVENOUS

## 2015-07-28 MED ORDER — ONDANSETRON HCL 4 MG/2ML IJ SOLN
4.0000 mg | Freq: Once | INTRAMUSCULAR | Status: AC
  Administered 2015-07-28: 4 mg via INTRAVENOUS

## 2015-07-28 MED ORDER — MORPHINE SULFATE (PF) 2 MG/ML IV SOLN
2.0000 mg | INTRAVENOUS | Status: DC | PRN
  Administered 2015-07-28 (×4): 2 mg via INTRAVENOUS
  Filled 2015-07-28 (×4): qty 1

## 2015-07-28 MED ORDER — LACTATED RINGERS IV SOLN
INTRAVENOUS | Status: DC
  Administered 2015-07-28: 04:00:00 via INTRAVENOUS

## 2015-07-28 MED ORDER — HYDROMORPHONE HCL 1 MG/ML IJ SOLN
1.0000 mg | Freq: Once | INTRAMUSCULAR | Status: AC
  Administered 2015-07-28: 1 mg via INTRAVENOUS
  Filled 2015-07-28: qty 1

## 2015-07-28 MED ORDER — ACETAMINOPHEN 650 MG RE SUPP: 650.0000 mg | Freq: Four times a day (QID) | RECTAL | Status: DC | PRN

## 2015-07-28 MED ORDER — IOPAMIDOL (ISOVUE-300) INJECTION 61%
100.0000 mL | Freq: Once | INTRAVENOUS | Status: AC | PRN
  Administered 2015-07-28: 100 mL via INTRAVENOUS

## 2015-07-28 MED ORDER — SODIUM CHLORIDE 0.9 % IV SOLN
INTRAVENOUS | Status: AC
  Administered 2015-07-28: 14:00:00 via INTRAVENOUS

## 2015-07-28 MED ORDER — SODIUM CHLORIDE 0.9 % IV BOLUS (SEPSIS)
1000.0000 mL | Freq: Once | INTRAVENOUS | Status: AC
  Administered 2015-07-28: 1000 mL via INTRAVENOUS

## 2015-07-28 MED ORDER — ASPIRIN EC 81 MG PO TBEC
81.0000 mg | DELAYED_RELEASE_TABLET | ORAL | Status: DC
  Administered 2015-07-28: 81 mg via ORAL
  Filled 2015-07-28 (×2): qty 1

## 2015-07-28 MED ORDER — LOSARTAN POTASSIUM 50 MG PO TABS
25.0000 mg | ORAL_TABLET | Freq: Every day | ORAL | Status: DC
  Administered 2015-07-28: 25 mg via ORAL
  Filled 2015-07-28: qty 1

## 2015-07-28 MED ORDER — ACETAMINOPHEN 325 MG PO TABS: 650.0000 mg | ORAL_TABLET | Freq: Four times a day (QID) | ORAL | Status: DC | PRN

## 2015-07-28 MED ORDER — ONDANSETRON HCL 4 MG/2ML IJ SOLN: 4.0000 mg | Freq: Four times a day (QID) | INTRAMUSCULAR | Status: DC | PRN

## 2015-07-28 NOTE — Progress Notes (Signed)
Pt setup on home CPAP unit. 

## 2015-07-28 NOTE — ED Provider Notes (Signed)
CSN: [id number]     Arrival date & time 07/27/15  2336 History  By signing my name below, I, Russell Carney, attest that this documentation has been prepared under the direction and in the presence of Russell Porter, MD at 00:41 AM . Electronically Signed: Higinio Carney, Scribe. 07/28/2015. 1:01 AM.   Chief Complaint  Patient presents with  . Abdominal Pain   The history is provided by the patient and the spouse. No language interpreter was used.   HPI Comments:  Russell Carney is a 60 y.o. male with PMHx of HTN and diverticulitis, who presents to the Emergency Department complaining of sudden onset, gradually worsening, constant, upper, sharp, abdominal pain that began this evening around 8 pm after eating homemade ice cream. Pt reports associated nausea, but denies vomiting or diarrhea.. He states this pain is similar to pain he has had before with pancreatitis. Pt states he had pancreatitis in December; pt notes his doctors believed his pancreatitis was caused by hydrochlorothiazide so he was taken off of it. Per wife, they had just returned home from a friend's house this evening in which pt had homemade ice cream; pt states he does not normally eat ice cream. He states he drinks occasionally; per wife, pt had a drink yesterday evening. Pt's wife states she gave pt infant gas medication this evening with no relief. Per wife, pt 's abdomen feels "pretty hard." Pt denies vomiting or diarrhea.   PCP Dr. Dorthy Carney  Past Medical History  Diagnosis Date  . Hypertension   . Arthritis     in his knees  . 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 Sleep apnea     wears CPAP, setting = 4  . Diverticulosis   . Pancreas (digestive gland) works poorly [facility])    Past Surgical History  Procedure Laterality Date  . Knee arthroscopy      x 3  . Vasectomy    . Colonoscopy      x 4  . Tonsillectomy    . Eye surgery      lasik back in 2014  . Cervical discectomy  04/24/2014    C6 C7  . Anterior cervical  decomp/discectomy fusion N/A 04/24/2014    Procedure: ANTERIOR CERVICAL DISCECTOMY FUSION C6 - C7 1 LEVEL;  Surgeon: Melina Schools, MD;  Location: [facility];  Service: Orthopedics;  Laterality: N/A;   Family History  Problem Relation Age of Onset  . Cancer Sister     ovarian  . Hypertension Sister    Social History  Substance Use Topics  . Smoking status: Never Smoker   . Smokeless tobacco: Never Used  . Alcohol Use: 0.0 oz/week    0 Standard drinks or equivalent per week     Comment: rare  lives at home Lives with spouse Last ETOH was on July 8  Review of Systems  Constitutional: Negative for fever.  Gastrointestinal: Positive for nausea. Negative for vomiting and diarrhea.  All other systems reviewed and are negative.  Allergies  Lisinopril  Home Medications   Prior to Admission medications   Medication Sig Start Date End Date Taking? Authorizing Provider  aspirin EC 81 MG tablet Take 81 mg by mouth every other day.    Historical Provider, MD  celecoxib (CELEBREX) 200 MG capsule Take 200 mg by mouth daily.    Historical Provider, MD  cholecalciferol (VITAMIN D) 1000 UNITS tablet Take 1,000 Units by mouth daily.  Historical Provider, MD  Coenzyme Q10 (COQ10) 100 MG CAPS Take 1 capsule by mouth daily.    Historical Provider, MD  folic acid (FOLVITE) A999333 MCG tablet Take 400 mcg by mouth daily.    Historical Provider, MD  Glucosamine-Chondroit-Vit C-Mn (GLUCOSAMINE 1500 COMPLEX) CAPS Take 1 capsule by mouth daily.    Historical Provider, MD  ibuprofen (ADVIL,MOTRIN) 600 MG tablet Take 600 mg by mouth every 6 (six) hours as needed for fever, headache, mild pain, moderate pain or cramping.    Historical Provider, MD  losartan (COZAAR) 25 MG tablet Take 25 mg by mouth daily. 01/25/14   Historical Provider, MD  Multiple Vitamin (MULTIVITAMIN WITH MINERALS) TABS tablet Take 1 tablet by mouth daily.    Historical Provider, MD  Olive Leaf Extract 500 MG CAPS Take 1 capsule by mouth daily.     Historical Provider, MD  Probiotic Product (PROBIOTIC DAILY) CAPS Take 1 capsule by mouth daily.    Historical Provider, MD  simvastatin (ZOCOR) 20 MG tablet Take 20 mg by mouth every evening.     Historical Provider, MD  zolpidem (AMBIEN) 10 MG tablet Take 10 mg by mouth at bedtime as needed for sleep.    Historical Provider, MD   BP 126/73 mmHg  Pulse 53  Temp(Src) 98 F (36.7 C) (Oral)  Resp 26  Ht 5\' 9"  (1.753 m)  Wt 210 lb (95.255 kg)  BMI 31.00 kg/m2  SpO2 97%  Vital signs normal except for bradycardia  Physical Exam  Constitutional: He is oriented to person, place, and time. He appears well-developed and well-nourished.  Non-toxic appearance. He does not appear ill. He appears distressed.  HENT:  Head: Normocephalic and atraumatic.  Right Ear: External ear normal.  Left Ear: External ear normal.  Nose: Nose normal. No mucosal edema or rhinorrhea.  Mouth/Throat: Oropharynx is clear and moist and mucous membranes are normal. No dental abscesses or uvula swelling.  Eyes: Conjunctivae and EOM are normal. Pupils are equal, round, and reactive to light.  Neck: Normal range of motion and full passive range of motion without pain. Neck supple.  Cardiovascular: Normal rate, regular rhythm and normal heart sounds.  Exam reveals no gallop and no friction rub.   No murmur heard. Pulmonary/Chest: Effort normal and breath sounds normal. No respiratory distress. He has no wheezes. He has no rhonchi. He has no rales. He exhibits no tenderness and no crepitus.  Abdominal: Soft. Normal appearance. He exhibits distension. There is tenderness. There is no rebound and no guarding.    Diffuse tenderness to upper abdomen, pt states it is worse in the epigastric and LUQ  Musculoskeletal: Normal range of motion. He exhibits no edema or tenderness.  Moves all extremities well.   Neurological: He is alert and oriented to person, place, and time. He has normal strength. No cranial nerve deficit.   Skin: Skin is warm, dry and intact. No rash noted. No erythema. No pallor.  Psychiatric: He has a normal mood and affect. His speech is normal and behavior is normal. His mood appears not anxious.  Nursing note and vitals reviewed.   ED Course  Procedures   Medications  ondansetron (ZOFRAN) injection 4 mg (4 mg Intravenous Given 07/28/15 0036)  sodium chloride 0.9 % bolus 1,000 mL (1,000 mLs Intravenous New Bag/Given 07/28/15 0057)  sodium chloride 0.9 % bolus 500 mL (500 mLs Intravenous New Bag/Given 07/28/15 0135)  HYDROmorphone (DILAUDID) injection 1 mg (1 mg Intravenous Given 07/28/15 0057)  HYDROmorphone (DILAUDID) injection 1  mg (1 mg Intravenous Given 07/28/15 0314)  iopamidol (ISOVUE-300) 61 % injection 100 mL (100 mLs Intravenous Contrast Given 07/28/15 0423)     DIAGNOSTIC STUDIES:  Oxygen Saturation is 97% on RA, normal by my interpretation.    COORDINATION OF CARE:  12:48 AM Discussed His lab results which are consistent with him having pancreatitis again and treatment Carney, which includes CT with pt at bedside and pt agreed to Carney. Patient was given IV fluids and IV pain and nausea medication.  3:09 AM patient requesting more pain medication prior to going to CT. His pain medicine was repeated.  03:00 AM Dr Lorin Mercy, hospitalist, will admit   Labs Review Results for orders placed or performed during the hospital encounter of 07/27/15  Lipase, blood  Result Value Ref Range   Lipase 4163 (H) 11 - 51 U/L  Comprehensive metabolic panel  Result Value Ref Range   Sodium 137 135 - 145 mmol/L   Potassium 3.9 3.5 - 5.1 mmol/L   Chloride 102 101 - 111 mmol/L   CO2 26 22 - 32 mmol/L   Glucose, Bld 156 (H) 65 - 99 mg/dL   BUN 17 6 - 20 mg/dL   Creatinine, Ser 1.11 0.61 - 1.24 mg/dL   Calcium 8.7 (L) 8.9 - 10.3 mg/dL   Total Protein 7.2 6.5 - 8.1 g/dL   Albumin 4.6 3.5 - 5.0 g/dL   AST 108 (H) 15 - 41 U/L   ALT 89 (H) 17 - 63 U/L   Alkaline Phosphatase 75 38 - 126 U/L    Total Bilirubin 1.3 (H) 0.3 - 1.2 mg/dL   GFR calc non Af Amer >60 >60 mL/min   GFR calc Af Amer >60 >60 mL/min   Anion gap 9 5 - 15  CBC  Result Value Ref Range   WBC 11.5 (H) 4.0 - 10.5 K/uL   RBC 5.34 4.22 - 5.81 MIL/uL   Hemoglobin 16.6 13.0 - 17.0 g/dL   HCT 47.6 39.0 - 52.0 %   MCV 89.1 78.0 - 100.0 fL   MCH 31.1 26.0 - 34.0 pg   MCHC 34.9 30.0 - 36.0 g/dL   RDW 13.1 11.5 - 15.5 %   Platelets 188 150 - 400 K/uL  Urinalysis, Routine w reflex microscopic  Result Value Ref Range   Color, Urine YELLOW YELLOW   APPearance CLEAR CLEAR   Specific Gravity, Urine 1.020 1.005 - 1.030   pH 5.0 5.0 - 8.0   Glucose, UA NEGATIVE NEGATIVE mg/dL   Hgb urine dipstick SMALL (A) NEGATIVE   Bilirubin Urine NEGATIVE NEGATIVE   Ketones, ur NEGATIVE NEGATIVE mg/dL   Protein, ur NEGATIVE NEGATIVE mg/dL   Nitrite NEGATIVE NEGATIVE   Leukocytes, UA NEGATIVE NEGATIVE  Urine microscopic-add on  Result Value Ref Range   Squamous Epithelial / LPF 0-5 (A) NONE SEEN   WBC, UA 0-5 0 - 5 WBC/hpf   RBC / HPF 0-5 0 - 5 RBC/hpf   Bacteria, UA FEW (A) NONE SEEN   Laboratory interpretation all normal except Very elevated lipase, mild leukocytosis   Imaging Review Ct Abdomen Pelvis W Contrast  07/28/2015  CLINICAL DATA:  Bilateral and umbilical abdominal pain and nausea for 1 day. History of pancreatitis. EXAM: CT ABDOMEN AND PELVIS WITH CONTRAST TECHNIQUE: Multidetector CT imaging of the abdomen and pelvis was performed using the standard protocol following bolus administration of intravenous contrast. CONTRAST:  118mL ISOVUE-300 IOPAMIDOL (ISOVUE-300) INJECTION 61% COMPARISON:  12/02/2014 FINDINGS: Atelectasis or infiltration in the lung  bases. Diffuse fatty infiltration of the liver. Infiltration and edema around the pancreas, demonstrating progression since previous study. Fluid tracking along the pericolic gutters bilaterally. Changes are consistent with acute pancreatitis. Pancreatic parenchymal  enhancement is homogeneous. No evidence of pancreatic necrosis. No developing loculated fluid collections. Portal veins and splenic veins appear patent. The gallbladder, spleen, adrenal glands, kidneys, abdominal aorta, inferior vena cava, and retroperitoneal lymph nodes are unremarkable. Stomach, small bowel, and colon are not abnormally distended. No free air in the abdomen. Pelvis: The appendix is normal. Diverticulosis of the sigmoid colon without evidence of diverticulitis. Bladder wall is not thickened. Prostate gland is not enlarged. IMPRESSION: Progression of inflammatory infiltration around the pancreas consistent with acute pancreatitis. No loculated fluid collections or pancreatic necrosis demonstrated. Electronically Signed   By: Lucienne Capers M.D.   On: 07/28/2015 04:42      I have personally reviewed and evaluated these images and lab results as part of my medical decision-making.   EKG Interpretation   Date/Time:  Monday July 28 2015 00:04:28 EDT Ventricular Rate:  68 PR Interval:    QRS Duration: 154 QT Interval:  436 QTC Calculation: 464 R Axis:   -67 Text Interpretation:  Sinus rhythm RBBB and LAFB No old tracing to compare  Confirmed by Alegra Rost  MD-I, Nelson Julson ([id number]) on 07/28/2015 12:46:53 AM      MDM   patient presents with acute abdominal pain with nausea that is similar to when he had pancreatitis in the past. Patient has a very elevated lipase of 4000. His CT scan is consistent with acute pancreatitis without complication. He was started on IV fluids and IV pain and nausea medication. He was admitted to the medicine service.    Final diagnoses:  Acute pancreatitis, unspecified complication status, unspecified pancreatitis type    Carney admission  Russell Porter, MD, FACEP  I personally performed the services described in this documentation, which was scribed in my presence. The recorded information has been reviewed and considered.  Russell Porter, MD, Barbette Or,  MD 07/28/15 [phone]

## 2015-07-28 NOTE — ED Notes (Signed)
Pt moaning , diaphoretic and rolling about the stretcher- He reports a meal of roast beef, mushrooms, and onions as well as homemade ice cream prior to abd pain- He rpeorts his pain is increasing and that it is similar to the pancreatitis he has had in the past

## 2015-07-28 NOTE — ED Notes (Signed)
Pt reports his pain is increasing

## 2015-07-28 NOTE — H&P (Addendum)
History and Physical    Russell Carney [id number] DOB: 11-May-1955 DOA: 07/27/2015  PCP: [name], MD Consultants:  OSA - ?; [place] Patient coming from: home  Chief Complaint: abdominal pain  HPI: Russell Carney is a 60 y.o. male with medical history significant of HTN, HLD, and pancreatitis. Patient reports that 1 year ago he came in with the same problem but nothing showed up.  Similar symptoms in November and went to [facility] and diagnosed with pancreatitis.  No problems since until tonight.  A little after 2000 he had sudden onset of pain and tightness in LUQ with nausea but no vomiting.  Day was not any different than usual.  No fevers.  Prior pancreatitis thought to be due to HCTZ and he was taken off it.  Drinks very little ETOH - 1 drink last night which the first time in weeks.  Does still have GB - RUQ previously was negative.  Takes medications for cholesterol and it has been under control for several years.      ED Course: Markedly elevated lipase, in conjunction with symptoms c/w pancreatitis.  Ordered CT.  IVF, pain and nausea medications.  Review of Systems: As per HPI; otherwise 10 point review of systems reviewed and negative.   Ambulatory Status: ambulatory  Past Medical History  Diagnosis Date  . Hypertension   . Arthritis     in his knees  . 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 Sleep apnea     wears CPAP, setting = 4  . Diverticulosis   . Pancreas (digestive gland) works poorly [facility])     Past Surgical History  Procedure Laterality Date  . Knee arthroscopy      x 3  . Vasectomy    . Colonoscopy      x 4  . Tonsillectomy    . Eye surgery      lasik back in 2014  . Cervical discectomy  04/24/2014    C6 C7  . Anterior cervical decomp/discectomy fusion N/A 04/24/2014    Procedure: ANTERIOR CERVICAL DISCECTOMY FUSION C6 - C7 1 LEVEL;  Surgeon: Melina Schools, MD;  Location: [place];  Service: Orthopedics;  Laterality:  N/A;    Social History   Social History  . Marital Status: Married    Spouse Name: N/A  . Number of Children: N/A  . Years of Education: N/A   Occupational History  . lab tech    Social History Main Topics  . Smoking status: Never Smoker   . Smokeless tobacco: Never Used  . Alcohol Use: 0.0 oz/week    0 Standard drinks or equivalent per week     Comment: rare  . Drug Use: No  . Sexual Activity: Not Currently   Other Topics Concern  . Not on file   Social History Narrative    Allergies  Allergen Reactions  . Lisinopril Cough    Family History  Problem Relation Age of Onset  . Cancer Sister     ovarian  . Hypertension Sister     Prior to Admission medications   Medication Sig Start Date End Date Taking? Authorizing Provider  aspirin EC 81 MG tablet Take 81 mg by mouth every other day.    Historical Provider, MD  celecoxib (CELEBREX) 200 MG capsule Take 200 mg by mouth daily.    Historical Provider, MD  cholecalciferol (VITAMIN D) 1000 UNITS tablet Take 1,000 Units by mouth  daily.    Historical Provider, MD  Coenzyme Q10 (COQ10) 100 MG CAPS Take 1 capsule by mouth daily.    Historical Provider, MD  folic acid (FOLVITE) A999333 MCG tablet Take 400 mcg by mouth daily.    Historical Provider, MD  Glucosamine-Chondroit-Vit C-Mn (GLUCOSAMINE 1500 COMPLEX) CAPS Take 1 capsule by mouth daily.    Historical Provider, MD  ibuprofen (ADVIL,MOTRIN) 600 MG tablet Take 600 mg by mouth every 6 (six) hours as needed for fever, headache, mild pain, moderate pain or cramping.    Historical Provider, MD  losartan (COZAAR) 25 MG tablet Take 25 mg by mouth daily. 01/25/14   Historical Provider, MD  Multiple Vitamin (MULTIVITAMIN WITH MINERALS) TABS tablet Take 1 tablet by mouth daily.    Historical Provider, MD  Olive Leaf Extract 500 MG CAPS Take 1 capsule by mouth daily.    Historical Provider, MD  Probiotic Product (PROBIOTIC DAILY) CAPS Take 1 capsule by mouth daily.    Historical  Provider, MD  simvastatin (ZOCOR) 20 MG tablet Take 20 mg by mouth every evening.     Historical Provider, MD  zolpidem (AMBIEN) 10 MG tablet Take 10 mg by mouth at bedtime as needed for sleep.    Historical Provider, MD    Physical Exam: Filed Vitals:   07/28/15 0200 07/28/15 0230 07/28/15 0300 07/28/15 0314  BP: 123/73 127/75 131/78 131/78  Pulse: 64 57  68  Temp:      TempSrc:      Resp: 21 20 16 16   Height:      Weight:      SpO2: 99% 99%  99%     General: Appears calm and comfortable and is NAD Eyes:  PERRL, EOMI, normal lids, iris ENT:  grossly normal hearing, lips & tongue, mmm Neck:  no LAD, masses or thyromegaly Cardiovascular:  RRR, no m/r/g. No LE edema.  Respiratory:  CTA bilaterally, no w/r/r. Normal respiratory effort. Abdomen: soft, diffusely tender but moreso in LUQ, nd, NABS Skin:  no rash or induration seen on limited exam Musculoskeletal:  grossly normal tone BUE/BLE, good ROM, no bony abnormality Psychiatric:  grossly normal mood and affect, speech fluent and appropriate, AOx3 Neurologic:  CN 2-12 grossly intact, moves all extremities in coordinated fashion, sensation intact  Labs on Admission: I have personally reviewed following labs and imaging studies  CBC:  Recent Labs Lab 07/28/15 0011  WBC 11.5*  HGB 16.6  HCT 47.6  MCV 89.1  PLT 0000000   Basic Metabolic Panel:  Recent Labs Lab 07/28/15 0011  NA 137  K 3.9  CL 102  CO2 26  GLUCOSE 156*  BUN 17  CREATININE 1.11  CALCIUM 8.7*   GFR: Estimated Creatinine Clearance: 79.6 mL/min (by C-G formula based on Cr of 1.11). Liver Function Tests:  Recent Labs Lab 07/28/15 0011  AST 108*  ALT 89*  ALKPHOS 75  BILITOT 1.3*  PROT 7.2  ALBUMIN 4.6    Recent Labs Lab 07/28/15 0011  LIPASE 4163*   No results for input(s): AMMONIA in the last 168 hours. Coagulation Profile: No results for input(s): INR, PROTIME in the last 168 hours. Cardiac Enzymes: No results for input(s):  CKTOTAL, CKMB, CKMBINDEX, TROPONINI in the last 168 hours. BNP (last 3 results) No results for input(s): PROBNP in the last 8760 hours. HbA1C: No results for input(s): HGBA1C in the last 72 hours. CBG: No results for input(s): GLUCAP in the last 168 hours. Lipid Profile: No results for input(s): CHOL, HDL,  LDLCALC, TRIG, CHOLHDL, LDLDIRECT in the last 72 hours. Thyroid Function Tests: No results for input(s): TSH, T4TOTAL, FREET4, T3FREE, THYROIDAB in the last 72 hours. Anemia Panel: No results for input(s): VITAMINB12, FOLATE, FERRITIN, TIBC, IRON, RETICCTPCT in the last 72 hours. Urine analysis:    Component Value Date/Time   COLORURINE AMBER* 12/02/2014 0957   APPEARANCEUR CLOUDY* 12/02/2014 0957   LABSPEC 1.018 12/02/2014 0957   PHURINE 5.0 12/02/2014 0957   GLUCOSEU NEGATIVE 12/02/2014 0957   HGBUR NEGATIVE 12/02/2014 0957   BILIRUBINUR NEGATIVE 12/02/2014 0957   KETONESUR NEGATIVE 12/02/2014 0957   PROTEINUR NEGATIVE 12/02/2014 0957   UROBILINOGEN 1.0 12/02/2014 0957   NITRITE NEGATIVE 12/02/2014 0957   LEUKOCYTESUR NEGATIVE 12/02/2014 0957    Creatinine Clearance: Estimated Creatinine Clearance: 79.6 mL/min (by C-G formula based on Cr of 1.11).  Sepsis Labs: @LABRCNTIP (procalcitonin:4,lacticidven:4) )No results found for this or any previous visit (from the past 240 hour(s)).   Radiological Exams on Admission: No results found.  EKG: Independently reviewed.  NSR with rate 68; RBBB and LAFB   Assessment/Plan Principal Problem:   Pancreatitis Active Problems:   HTN (hypertension)   Dyslipidemia   OSA (obstructive sleep apnea)   Acute pancreatitis -This is his third episode -Previous episode thought to be due to HCTZ; current episode could be from simvastatin or perhaps one of his many supplements -[place] in 11/16 showed normal GB and fatty liver; could consider repeat RUQ [place] -CT ordered by EDP and pending -Lipids in 11/16 showed nl TG (46) with LDL 109;  unlikely need to repeat this at this time -Will admit to med-surg -IVF at 200cc/hr for now -NPO -IV morphine prn pain -Zofran as needed for n/v  -Anticipate 3-4 day hospitalization, as pancreatitis generally does not resolve very quickly.  HTN -Continue Cozaar  HLD -Holding simvastatin as it can cause pancreatitis -LDL not at goal even on simva -Will need to consider alternative options for lipid management (red yeast rice, Zetia, etc).  OSA -Will order CPAP, home setting = 4  DVT prophylaxis:  Lovenox Code Status:  Full - confirmed with patient/family Family Communication: Wife present at the bedside and in agreement with the plan Disposition Plan: Home once clinically improved Consults called: None Admission status: Admit to Med-Surg   Karmen Bongo MD [facility]  If 7PM-7AM, please contact night-coverage [email or web address] Password [email or web address]  07/28/2015, 3:20 AM

## 2015-07-28 NOTE — Progress Notes (Signed)
Patient seen and examined  60 y.o. male with medical history significant of HTN, HLD, and pancreatitis. Patient reports that 1 year ago he came in with the same problem but nothing showed up. Similar symptoms in November and went to [facility] and diagnosed with pancreatitis. No problems since until tonight. A little after 2000 he had sudden onset of pain and tightness in LUQ with nausea but no vomiting.  Prior pancreatitis thought to be due to HCTZ and he was taken off it. Drinks very little ETOH - 1 drink last night which the first time in weeks. Does still have GB - RUQ previously was negative. Takes medications for cholesterol and it has been under control for several years.  ED Course: Markedly elevated lipase, in conjunction with symptoms c/w pancreatitis.  Assessment and plan Acute pancreatitis -This is his third episode -Previous episode thought to be due to HCTZ; current episode could be from simvastatin or perhaps one of his many supplements -[place] in 11/16 showed normal GB and fatty liver; could consider repeat RUQ [place] if liver function becomes abnormal -CT  abdomen pelvis shows progressive inflammatory infiltration around the pancreas consistent with acute pancreatitis -Lipids in 11/16 showed nl TG (46) with LDL 109; unlikely need to repeat this at this time Continue current treatment and aggressive hydration -NPO -IV morphine prn pain -Zofran as needed for n/v  -Anticipate 3-4 day hospitalization, as pancreatitis generally does not resolve very quickly. Will call GI to evaluate for recurrent pancreatitis   HTN   hold Cozaar , monitor labs and renal function in the setting of severe pancreatitis   HLD -Holding simvastatin as it can cause pancreatitis -LDL not at goal even on simva -Will need to consider alternative options for lipid management (red yeast rice, Zetia, etc).  OSA -Will order CPAP, home setting = 4

## 2015-07-28 NOTE — ED Notes (Signed)
Patient placed on 2LPM of oxygen via [place] at 2 LPM. sats increased to 98%.

## 2015-07-28 NOTE — Care Management Note (Signed)
Case Management Note  Patient Details  Name: Russell Carney MRN: [id number] Date of Birth: 08-22-1955  Subjective/Objective: Patient admitted from home with pancreatitis. He is ind with ADL's. His PCP is Dr. Gretchen Short of [place]. He reports no issues obtaining medications.                     Action/Plan: Anticipate d/c home with self care. No CM needs.    Expected Discharge Date:        07/30/2015          Expected Discharge Plan:  Home/Self Care  In-House Referral:  NA  Discharge planning Services  CM Consult  Post Acute Care Choice:  NA Choice offered to:  NA  DME Arranged:    DME Agency:     HH Arranged:    HH Agency:     Status of Service:  Completed, signed off  If discussed at [employer] of Stay Meetings, dates discussed:    Additional Comments:  Tamarcus Condie, Chauncey Reading, RN 07/28/2015, 12:53 PM

## 2015-07-28 NOTE — Consult Note (Signed)
Referring Provider: Dr. Allyson Sabal  Primary Care Physician:  [name], MD Primary Gastroenterologist:  Dr. Oletta Lamas in [place]   Date of Admission: 07/28/15 Date of Consultation: 07/28/15  Reason for Consultation:  Acute pancreatitis   HPI:  Russell Carney is a 60 y.o. year old male who presented to the ED with abdominal pain and found to have a lipase greater than 4000. In Nov of last year, he presented to the hospital in [place] with a lipase in the 3000 range with evidence of acute uncomplicated pancreatitis via CT scan, AST 216, ALT 167, normal gallbladder. Consideration was raised for idiopathic vs medication effect (HCTZ).  He was not seen by GI at that time. CT abd/pelvis with contrast this admission with acute pancreatitis but no evidence of complicating features. AST 108, ALT 89 on admission, now down to 62 and 82 respectively. Lipase has fallen to 1277.   First onset of similar type of pain was in July of last year, 2016. No elevation of lipase, CT at that time was normal. Similar symptoms in Nov, which prompted presentation to [facility], where he was found to have acute pancreatitis. Acute onset of upper abdominal pain last night around 8pm. Played golf yesterday and felt very tired. Onset was immediate. Associated nausea. Has an occasional crown royal on a Saturday night at dinner. Doesn't drink much but on vacation will have 2 glasses of wine. Gallbladder remains in situ. Takes "digestive enzymes" with lunch and dinner from OTC. No longer on HCTZ since Nov 2016.   Prior colonoscopies by Dr. Oletta Lamas in [place]. Wife at bedside and states that he is due for another procedure in the near future with a remote history of polyps.   Past Medical History  Diagnosis Date  . Hypertension   . Arthritis     in his knees  . 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 Sleep apnea     wears CPAP, setting = 4  . Diverticulosis   . Pancreatitis     Past Surgical History   Procedure Laterality Date  . Knee arthroscopy      x 3  . Vasectomy    . Colonoscopy      x 4. Dr. Oletta Lamas in [place]. Remote history of polyps.   . Tonsillectomy    . Eye surgery      lasik back in 2014  . Cervical discectomy  04/24/2014    C6 C7  . Anterior cervical decomp/discectomy fusion N/A 04/24/2014    Procedure: ANTERIOR CERVICAL DISCECTOMY FUSION C6 - C7 1 LEVEL;  Surgeon: Melina Schools, MD;  Location: [place];  Service: Orthopedics;  Laterality: N/A;    Prior to Admission medications   Medication Sig Start Date End Date Taking? Authorizing Provider  Ascorbic Acid (ACEROLA-C PO) Take 125 mg by mouth daily.   Yes Historical Provider, MD  aspirin EC 81 MG tablet Take 81 mg by mouth every other day.   Yes Historical Provider, MD  celecoxib (CELEBREX) 200 MG capsule Take 200 mg by mouth daily.   Yes Historical Provider, MD  cholecalciferol (VITAMIN D) 1000 UNITS tablet Take 1,000 Units by mouth daily.   Yes Historical Provider, MD  Coenzyme Q10 (COQ10) 100 MG CAPS Take 1 capsule by mouth daily.   Yes Historical Provider, MD  Digestive Enzymes (DIGESTIVE ENZYME PO) Take 2 tablets by mouth 2 (two) times daily.   Yes Historical Provider, MD  folic acid (FOLVITE) [id number] MCG  tablet Take 400 mcg by mouth daily.   Yes Historical Provider, MD  Glucosamine-Chondroit-Vit C-Mn (GLUCOSAMINE 1500 COMPLEX) CAPS Take 1 capsule by mouth daily.   Yes Historical Provider, MD  ibuprofen (ADVIL,MOTRIN) 200 MG tablet Take 400 mg by mouth every 6 (six) hours as needed for moderate pain.   Yes Historical Provider, MD  losartan (COZAAR) 25 MG tablet Take 25 mg by mouth daily. 01/25/14  Yes Historical Provider, MD  Menthol, Topical Analgesic, (BLUE-EMU MAXIMUM STRENGTH EX) Apply 1 application topically daily as needed (knee pain).   Yes Historical Provider, MD  Multiple Vitamin (MULTIVITAMIN WITH MINERALS) TABS tablet Take 1 tablet by mouth daily.   Yes Historical Provider, MD  Olive Leaf Extract 500 MG CAPS  Take 1 capsule by mouth daily.   Yes Historical Provider, MD  Probiotic Product (PROBIOTIC DAILY) CAPS Take 1 capsule by mouth daily.   Yes Historical Provider, MD  simvastatin (ZOCOR) 20 MG tablet Take 20 mg by mouth every evening.    Yes Historical Provider, MD  zolpidem (AMBIEN) 10 MG tablet Take 10 mg by mouth at bedtime as needed for sleep.   Yes Historical Provider, MD    Current Facility-Administered Medications  Medication Dose Route Frequency Provider Last Rate Last Dose  . 0.9 %  sodium chloride infusion   Intravenous Continuous Reyne Dumas, MD 150 mL/hr at 07/28/15 0958    . acetaminophen (TYLENOL) tablet 650 mg  650 mg Oral Q6H PRN Karmen Bongo, MD       Or  . acetaminophen (TYLENOL) suppository 650 mg  650 mg Rectal Q6H PRN Karmen Bongo, MD      . aspirin EC tablet 81 mg  81 mg Oral Cathlean Sauer, MD   81 mg at 07/28/15 [id number]  . enoxaparin (LOVENOX) injection 40 mg  40 mg Subcutaneous Q24H Karmen Bongo, MD   40 mg at 07/28/15 0400  . morphine 2 MG/ML injection 2 mg  2 mg Intravenous Q2H PRN Karmen Bongo, MD   2 mg at 07/28/15 0836  . ondansetron (ZOFRAN) 4 MG/2ML injection           . ondansetron (ZOFRAN) tablet 4 mg  4 mg Oral Q6H PRN Karmen Bongo, MD       Or  . ondansetron [facility]) injection 4 mg  4 mg Intravenous Q6H PRN Karmen Bongo, MD      . zolpidem [facility]) tablet 10 mg  10 mg Oral QHS PRN Karmen Bongo, MD        Allergies as of 07/27/2015 - Review Complete 07/27/2015  Allergen Reaction Noted  . Lisinopril Cough 04/24/2014    Family History  Problem Relation Age of Onset  . Cancer Sister     ovarian  . Hypertension Sister   . Colon cancer Father     mid 81s  . Pancreatic cancer Neg Hx     Social History   Social History  . Marital Status: Married    Spouse Name: N/A  . Number of Children: N/A  . Years of Education: N/A   Occupational History  . lab tech    Social History Main Topics  . Smoking status: Never Smoker   .  Smokeless tobacco: Never Used  . Alcohol Use: 0.0 oz/week    0 Standard drinks or equivalent per week     Comment: rare mixed drink on a Saturday evening   . Drug Use: No  . Sexual Activity: Not Currently   Other Topics Concern  . Not  on file   Social History Narrative    Review of Systems: Gen: Denies fever, chills, loss of appetite, change in weight or weight loss CV: Denies chest pain, heart palpitations, syncope, edema  Resp: +shortness of breath, feeling "tight" from abdominal pain  GI: see HPI  GU : Denies urinary burning, urinary frequency, urinary incontinence.  MS: +joint pain  Derm: Denies rash, itching, dry skin Psych: Denies depression, anxiety,confusion, or memory loss Heme: Denies bruising, bleeding, and enlarged lymph nodes.  Physical Exam: Vital signs in last 24 hours: Temp:  [98 F (36.7 C)-98.7 F (37.1 C)] 98.7 F (37.1 C) (07/10 0400) Pulse Rate:  [53-70] 70 (07/10 0400) Resp:  [16-26] 20 (07/10 0400) BP: (118-142)/(71-80) 142/78 mmHg (07/10 0400) SpO2:  [93 %-99 %] 99 % (07/10 0400) Weight:  [210 lb (95.255 kg)-223 lb 1.6 oz (101.197 kg)] 223 lb 1.6 oz (101.197 kg) (07/10 0400) Last BM Date: 07/27/15 General:   Alert,  Well-developed, well-nourished, pleasant and cooperative in NAD Head:  Normocephalic and atraumatic. Eyes:  Sclera clear, no icterus.   Conjunctiva pink. Ears:  Normal auditory acuity. Nose:  No deformity, discharge,  or lesions. Mouth:  No deformity or lesions, dentition normal. Lungs:  Clear throughout to auscultation.   No wheezes, crackles, or rhonchi. No acute distress. Heart:  Regular rate and rhythm; no murmurs, clicks, rubs,  or gallops. Abdomen:  Soft, moderate TTP upper abdomen and nondistended. No masses, hepatosplenomegaly or hernias noted. Normal bowel sounds, without guarding, and without rebound.   Rectal:  Deferred until time of colonoscopy.   Msk:  Symmetrical without gross deformities. Normal posture. Extremities:   Without  edema. Neurologic:  Alert and  oriented x4 Psych:  Alert and cooperative.   Intake/Output from previous day:   Intake/Output this shift:    Lab Results:  Recent Labs  07/28/15 0011 07/28/15 0525  WBC 11.5* 8.6  HGB 16.6 14.8  HCT 47.6 43.1  PLT 188 162   BMET  Recent Labs  07/28/15 0011 07/28/15 0525  NA 137 137  K 3.9 4.9  CL 102 105  CO2 26 27  GLUCOSE 156* 114*  BUN 17 15  CREATININE 1.11 1.01  CALCIUM 8.7* 8.2*   LFT  Recent Labs  07/28/15 0011 07/28/15 0525  PROT 7.2 6.2*  ALBUMIN 4.6 4.0  AST 108* 62*  ALT 89* 82*  ALKPHOS 75 65  BILITOT 1.3* 0.8   Lab Results  Component Value Date   LIPASE 1277* 07/28/2015    Studies/Results: Ct Abdomen Pelvis W Contrast  07/28/2015  CLINICAL DATA:  Bilateral and umbilical abdominal pain and nausea for 1 day. History of pancreatitis. EXAM: CT ABDOMEN AND PELVIS WITH CONTRAST TECHNIQUE: Multidetector CT imaging of the abdomen and pelvis was performed using the standard protocol following bolus administration of intravenous contrast. CONTRAST:  125mL ISOVUE-300 IOPAMIDOL (ISOVUE-300) INJECTION 61% COMPARISON:  12/02/2014 FINDINGS: Atelectasis or infiltration in the lung bases. Diffuse fatty infiltration of the liver. Infiltration and edema around the pancreas, demonstrating progression since previous study. Fluid tracking along the pericolic gutters bilaterally. Changes are consistent with acute pancreatitis. Pancreatic parenchymal enhancement is homogeneous. No evidence of pancreatic necrosis. No developing loculated fluid collections. Portal veins and splenic veins appear patent. The gallbladder, spleen, adrenal glands, kidneys, abdominal aorta, inferior vena cava, and retroperitoneal lymph nodes are unremarkable. Stomach, small bowel, and colon are not abnormally distended. No free air in the abdomen. Pelvis: The appendix is normal. Diverticulosis of the sigmoid colon without evidence of diverticulitis.  Bladder  wall is not thickened. Prostate gland is not enlarged. IMPRESSION: Progression of inflammatory infiltration around the pancreas consistent with acute pancreatitis. No loculated fluid collections or pancreatic necrosis demonstrated. Electronically Signed   By: Lucienne Capers M.D.   On: 07/28/2015 04:42    Impression: 60 year old male admitted with second documented episode of acute, uncomplicated pancreatitis, gallbladder remaining in situ. As of note, triglycerides are normal. Doubt med effect as culprit. Mild elevation of transaminases with improvement this morning. No evidence of gallbladder disease on imaging. Prior episode in Nov 2016 thought to be idiopathic versus med effect, with discontinuation of HCTZ at that time. At this point, would recommend supportive measures to include aggressive IV hydration per guidelines for next 6 hours (as he has no contraindication to this), then returning to a standard rate, continuing pain control measures, and pursuing an EUS as outpatient. If EUS is negative, would consider elective cholecystectomy at later date as outpatient. Patient will follow with Dr. Oletta Lamas as an outpatient as he is currently established with the GI practice in [place]. We will follow while inpatient.   Plan: Increase fluids to NS at 300 ml/hour for the next 6 hours, then return to 176ml/hour Continue supportive measures Strict NPO status for now Follow-up with Dr. Oletta Lamas as outpatient to arrange EUS Consider elective outpatient cholecystectomy at a later date   Orvil Feil, ANP-BC [facility]       LOS: 0 days    07/28/2015, 10:33 AM

## 2015-07-29 DIAGNOSIS — K859 Acute pancreatitis without necrosis or infection, unspecified: Principal | ICD-10-CM

## 2015-07-29 DIAGNOSIS — I10 Essential (primary) hypertension: Secondary | ICD-10-CM

## 2015-07-29 LAB — COMPREHENSIVE METABOLIC PANEL
ALBUMIN: 3.6 g/dL (ref 3.5–5.0)
ALK PHOS: 61 U/L (ref 38–126)
ALT: 52 U/L (ref 17–63)
ANION GAP: 4 — AB (ref 5–15)
AST: 22 U/L (ref 15–41)
BILIRUBIN TOTAL: 1.2 mg/dL (ref 0.3–1.2)
BUN: 10 mg/dL (ref 6–20)
CALCIUM: 8.1 mg/dL — AB (ref 8.9–10.3)
CO2: 28 mmol/L (ref 22–32)
Chloride: 105 mmol/L (ref 101–111)
Creatinine, Ser: 0.65 mg/dL (ref 0.61–1.24)
GFR calc Af Amer: >60 mL/min (ref >60)
GLUCOSE: 95 mg/dL (ref 65–99)
POTASSIUM: 3.7 mmol/L (ref 3.5–5.1)
Sodium: 137 mmol/L (ref 135–145)
TOTAL PROTEIN: 5.7 g/dL — AB (ref 6.5–8.1)

## 2015-07-29 LAB — CBC
HEMATOCRIT: 41.8 % (ref 39.0–52.0)
HEMOGLOBIN: 14.3 g/dL (ref 13.0–17.0)
MCH: 30.8 pg (ref 26.0–34.0)
MCHC: 34.2 g/dL (ref 30.0–36.0)
MCV: 90.1 fL (ref 78.0–100.0)
Platelets: 137 10*3/uL — ABNORMAL LOW (ref 150–400)
RBC: 4.64 MIL/uL (ref 4.22–5.81)
RDW: 13.1 % (ref 11.5–15.5)
WBC: 8 10*3/uL (ref 4.0–10.5)

## 2015-07-29 LAB — LIPASE, BLOOD: LIPASE: 200 U/L — AB (ref 11–51)

## 2015-07-29 NOTE — Progress Notes (Signed)
Pt resting with wife at bedside. Nad. States upper abd pain 2. States does not need any pain med.

## 2015-07-29 NOTE — Progress Notes (Signed)
[facility] PROGRESS NOTE  Russell Carney [id number] DOB: 1955/04/28 DOA: 07/27/2015   PCP: [name], MD     Assessment/Plan: Principal Problem:   Pancreatitis Active Problems:   HTN (hypertension)   Dyslipidemia   OSA (obstructive sleep apnea)   Acute pancreatitis   60 y.o. male with medical history significant of HTN, HLD, and pancreatitis. Patient reports that 1 year ago he came in with the same problem but nothing showed up. Similar symptoms in November and went to [facility] and diagnosed with pancreatitis. No problems since until tonight. A little after 2000 he had sudden onset of pain and tightness in LUQ with nausea but no vomiting. Prior pancreatitis thought to be due to HCTZ and he was taken off it. Drinks very little ETOH - 1 drink last night which the first time in weeks. Does still have GB - RUQ previously was negative. Takes medications for cholesterol and it has been under control for several years.  ED Course: Markedly elevated lipase, in conjunction with symptoms c/w pancreatitis.  Assessment and plan Acute pancreatitis Improving , lipase 4163> 200  -Previous episode thought to be due to HCTZ; current episode could be from simvastatin or perhaps one of his many supplements.No longer on HCTZ since Nov 2016 -[place] in 11/16 showed normal GB and fatty liver; could consider repeat RUQ [place] if liver function becomes abnormal -CT abdomen pelvis shows progressive inflammatory infiltration around the pancreas consistent with acute pancreatitis -Lipids in 11/16 showed nl TG (46) with LDL 109; unlikely need to repeat this at this time Continue current treatment and aggressive hydration  advance to clear liquid diet -IV morphine prn pain -Zofran as needed for n/v  -Anticipate 3-4 day hospitalization, as pancreatitis generally does not resolve very quickly.  Follow-up with Dr. Oletta Lamas as outpatient to arrange EUS   HTN  hold Cozaar , monitor labs and  renal function in the setting of severe pancreatitis   HLD -Holding simvastatin as it can cause pancreatitis -LDL not at goal even on simva -Will need to consider alternative options for lipid management (red yeast rice, Zetia, etc).  OSA -Will order CPAP, home setting = 4     DVT prophylaxsis Lovenox  CODE STATUS-full code   Family Communication: Discussed in detail with the patient, all imaging results, lab results explained to the patient   Disposition Plan:  Anticipate discharge tomorrow    Consultants:  None  Procedures:  None  Antibiotics: Anti-infectives    None         HPI/Subjective: Patient states that he has not required any pain medicine since 2 PM yesterday afternoon  Objective: Filed Vitals:   07/28/15 1500 07/28/15 2146 07/28/15 2301 07/29/15 0537  BP: 111/66 132/76  125/75  Pulse: 58 62 77 73  Temp: 98.4 F (36.9 C) 98 F (36.7 C)  98.3 F (36.8 C)  TempSrc: Oral Oral  Oral  Resp: 18 20  20   Height:      Weight:      SpO2: 95% 95% 93% 98%    Intake/Output Summary (Last 24 hours) at 07/29/15 0925 Last data filed at 07/29/15 0537  Gross per 24 hour  Intake 3507.5 ml  Output   1525 ml  Net 1982.5 ml    Exam:  Examination:  General exam: Appears calm and comfortable  Respiratory system: Clear to auscultation. Respiratory effort normal. Cardiovascular system: S1 & S2 heard, RRR. No JVD, murmurs, rubs, gallops or clicks. No pedal edema.  Gastrointestinal system: Abdomen is nondistended, soft and nontender. No organomegaly or masses felt. Normal bowel sounds heard. Central nervous system: Alert and oriented. No focal neurological deficits. Extremities: Symmetric 5 x 5 power. Skin: No rashes, lesions or ulcers Psychiatry: Judgement and insight appear normal. Mood & affect appropriate.     Data Reviewed: I have personally reviewed following labs and imaging studies  Micro Results No results found for this or any previous visit  (from the past 240 hour(s)).  Radiology Reports Ct Abdomen Pelvis W Contrast  07/28/2015  CLINICAL DATA:  Bilateral and umbilical abdominal pain and nausea for 1 day. History of pancreatitis. EXAM: CT ABDOMEN AND PELVIS WITH CONTRAST TECHNIQUE: Multidetector CT imaging of the abdomen and pelvis was performed using the standard protocol following bolus administration of intravenous contrast. CONTRAST:  181mL ISOVUE-300 IOPAMIDOL (ISOVUE-300) INJECTION 61% COMPARISON:  12/02/2014 FINDINGS: Atelectasis or infiltration in the lung bases. Diffuse fatty infiltration of the liver. Infiltration and edema around the pancreas, demonstrating progression since previous study. Fluid tracking along the pericolic gutters bilaterally. Changes are consistent with acute pancreatitis. Pancreatic parenchymal enhancement is homogeneous. No evidence of pancreatic necrosis. No developing loculated fluid collections. Portal veins and splenic veins appear patent. The gallbladder, spleen, adrenal glands, kidneys, abdominal aorta, inferior vena cava, and retroperitoneal lymph nodes are unremarkable. Stomach, small bowel, and colon are not abnormally distended. No free air in the abdomen. Pelvis: The appendix is normal. Diverticulosis of the sigmoid colon without evidence of diverticulitis. Bladder wall is not thickened. Prostate gland is not enlarged. IMPRESSION: Progression of inflammatory infiltration around the pancreas consistent with acute pancreatitis. No loculated fluid collections or pancreatic necrosis demonstrated. Electronically Signed   By: Lucienne Capers M.D.   On: 07/28/2015 04:42     CBC  Recent Labs Lab 07/28/15 0011 07/28/15 0525 07/29/15 0411  WBC 11.5* 8.6 8.0  HGB 16.6 14.8 14.3  HCT 47.6 43.1 41.8  PLT 188 162 137*  MCV 89.1 89.8 90.1  MCH 31.1 30.8 30.8  MCHC 34.9 34.3 34.2  RDW 13.1 13.2 13.1    Chemistries   Recent Labs Lab 07/28/15 0011 07/28/15 0525 07/29/15 0411  NA 137 137 137  K  3.9 4.9 3.7  CL 102 105 105  CO2 26 27 28   GLUCOSE 156* 114* 95  BUN 17 15 10   CREATININE 1.11 1.01 0.65  CALCIUM 8.7* 8.2* 8.1*  AST 108* 62* 22  ALT 89* 82* 52  ALKPHOS 75 65 61  BILITOT 1.3* 0.8 1.2   ------------------------------------------------------------------------------------------------------------------ estimated creatinine clearance is 113.7 mL/min (by C-G formula based on Cr of 0.65). ------------------------------------------------------------------------------------------------------------------ No results for input(s): HGBA1C in the last 72 hours. ------------------------------------------------------------------------------------------------------------------  Recent Labs  07/28/15 0525  CHOL 144  HDL 35*  LDLCALC 95  TRIG 68  CHOLHDL 4.1   ------------------------------------------------------------------------------------------------------------------ No results for input(s): TSH, T4TOTAL, T3FREE, THYROIDAB in the last 72 hours.  Invalid input(s): FREET3 ------------------------------------------------------------------------------------------------------------------ No results for input(s): VITAMINB12, FOLATE, FERRITIN, TIBC, IRON, RETICCTPCT in the last 72 hours.  Coagulation profile No results for input(s): INR, PROTIME in the last 168 hours.  No results for input(s): DDIMER in the last 72 hours.  Cardiac Enzymes No results for input(s): CKMB, TROPONINI, MYOGLOBIN in the last 168 hours.  Invalid input(s): CK ------------------------------------------------------------------------------------------------------------------ Invalid input(s): POCBNP   CBG: No results for input(s): GLUCAP in the last 168 hours.     Studies: Ct Abdomen Pelvis W Contrast  07/28/2015  CLINICAL DATA:  Bilateral and umbilical abdominal pain and nausea for  1 day. History of pancreatitis. EXAM: CT ABDOMEN AND PELVIS WITH CONTRAST TECHNIQUE: Multidetector CT imaging  of the abdomen and pelvis was performed using the standard protocol following bolus administration of intravenous contrast. CONTRAST:  141mL ISOVUE-300 IOPAMIDOL (ISOVUE-300) INJECTION 61% COMPARISON:  12/02/2014 FINDINGS: Atelectasis or infiltration in the lung bases. Diffuse fatty infiltration of the liver. Infiltration and edema around the pancreas, demonstrating progression since previous study. Fluid tracking along the pericolic gutters bilaterally. Changes are consistent with acute pancreatitis. Pancreatic parenchymal enhancement is homogeneous. No evidence of pancreatic necrosis. No developing loculated fluid collections. Portal veins and splenic veins appear patent. The gallbladder, spleen, adrenal glands, kidneys, abdominal aorta, inferior vena cava, and retroperitoneal lymph nodes are unremarkable. Stomach, small bowel, and colon are not abnormally distended. No free air in the abdomen. Pelvis: The appendix is normal. Diverticulosis of the sigmoid colon without evidence of diverticulitis. Bladder wall is not thickened. Prostate gland is not enlarged. IMPRESSION: Progression of inflammatory infiltration around the pancreas consistent with acute pancreatitis. No loculated fluid collections or pancreatic necrosis demonstrated. Electronically Signed   By: Lucienne Capers M.D.   On: 07/28/2015 04:42      Lab Results  Component Value Date   HGBA1C 5.6 12/02/2014   Lab Results  Component Value Date   LDLCALC 95 07/28/2015   CREATININE 0.65 07/29/2015       Scheduled Meds: . aspirin EC  81 mg Oral QODAY  . enoxaparin (LOVENOX) injection  40 mg Subcutaneous Q24H   Continuous Infusions: . sodium chloride 150 mL/hr at 07/29/15 0100     LOS: 1 day    Time spent: >30 MINS    [facility]  [facility] Pager [phone]. If 7PM-7AM, please contact night-coverage at [email or web address], password [facility] 07/29/2015, 9:25 AM  LOS: 1 day

## 2015-07-29 NOTE — Progress Notes (Signed)
Pt resting well. No needs at this time. Introduced self to pt. Urinal and call bell with in reach

## 2015-07-29 NOTE — Progress Notes (Signed)
Pt resting. Denies pain. Nad. Fluids running.

## 2015-07-29 NOTE — Progress Notes (Signed)
Subjective: Abdominal pain much improved. Drank coffee for breakfast. No N/V.   Objective: Vital signs in last 24 hours: Temp:  [98 F (36.7 C)-98.4 F (36.9 C)] 98.3 F (36.8 C) (07/11 0537) Pulse Rate:  [58-77] 73 (07/11 0537) Resp:  [18-20] 20 (07/11 0537) BP: (111-132)/(66-76) 125/75 mmHg (07/11 0537) SpO2:  [93 %-98 %] 98 % (07/11 0537) Last BM Date: 07/27/15 General:   Alert and oriented, pleasant Head:  Normocephalic and atraumatic. Eyes:  No icterus, sclera clear. Conjuctiva pink.  Abdomen:  Bowel sounds present, soft, minimal TTP, non-distended. No HSM or hernias noted. No rebound or guarding. No masses appreciated  Extremities:  Without edema. Neurologic:  Alert and  oriented x4 Psych:  Alert and cooperative. Normal mood and affect.  Intake/Output from previous day: 07/10 0701 - 07/11 0700 In: 3507.5 [I.V.:3507.5] Out: 1525 [Urine:1525] Intake/Output this shift:    Lab Results:  Recent Labs  07/28/15 0011 07/28/15 0525 07/29/15 0411  WBC 11.5* 8.6 8.0  HGB 16.6 14.8 14.3  HCT 47.6 43.1 41.8  PLT 188 162 137*   BMET  Recent Labs  07/28/15 0011 07/28/15 0525 07/29/15 0411  NA 137 137 137  K 3.9 4.9 3.7  CL 102 105 105  CO2 26 27 28   GLUCOSE 156* 114* 95  BUN 17 15 10   CREATININE 1.11 1.01 0.65  CALCIUM 8.7* 8.2* 8.1*   LFT  Recent Labs  07/28/15 0011 07/28/15 0525 07/29/15 0411  PROT 7.2 6.2* 5.7*  ALBUMIN 4.6 4.0 3.6  AST 108* 62* 22  ALT 89* 82* 52  ALKPHOS 75 65 61  BILITOT 1.3* 0.8 1.2     Studies/Results: Ct Abdomen Pelvis W Contrast  07/28/2015  CLINICAL DATA:  Bilateral and umbilical abdominal pain and nausea for 1 day. History of pancreatitis. EXAM: CT ABDOMEN AND PELVIS WITH CONTRAST TECHNIQUE: Multidetector CT imaging of the abdomen and pelvis was performed using the standard protocol following bolus administration of intravenous contrast. CONTRAST:  153mL ISOVUE-300 IOPAMIDOL (ISOVUE-300) INJECTION 61% COMPARISON:   12/02/2014 FINDINGS: Atelectasis or infiltration in the lung bases. Diffuse fatty infiltration of the liver. Infiltration and edema around the pancreas, demonstrating progression since previous study. Fluid tracking along the pericolic gutters bilaterally. Changes are consistent with acute pancreatitis. Pancreatic parenchymal enhancement is homogeneous. No evidence of pancreatic necrosis. No developing loculated fluid collections. Portal veins and splenic veins appear patent. The gallbladder, spleen, adrenal glands, kidneys, abdominal aorta, inferior vena cava, and retroperitoneal lymph nodes are unremarkable. Stomach, small bowel, and colon are not abnormally distended. No free air in the abdomen. Pelvis: The appendix is normal. Diverticulosis of the sigmoid colon without evidence of diverticulitis. Bladder wall is not thickened. Prostate gland is not enlarged. IMPRESSION: Progression of inflammatory infiltration around the pancreas consistent with acute pancreatitis. No loculated fluid collections or pancreatic necrosis demonstrated. Electronically Signed   By: Lucienne Capers M.D.   On: 07/28/2015 04:42    Assessment: 60 year old male admitted with second episode of acute, uncomplicated pancreatitis. Lipase markedly improved from admission, and transaminases have completely normalized. Gallbladder remains in situ. Recommend follow-up with Dr. Oletta Lamas as outpatient with elective cholecystectomy at later date and consideration of EUS to rule out occult malignancy as well. Clinically improved and doing well with supportive measures. Will hopefully be able to continue advancement of diet as tolerated to low fat.   Plan: Clear liquids, advance as tolerated to low fat Outpatient follow-up with Dr. Oletta Lamas Outpatient evaluation for elective cholecystectomy Consideration for EUS as  outpatient Hopeful discharge in 24-48 hours  Orvil Feil, ANP-BC [facility]    LOS: 1 day    07/29/2015,  8:11 AM

## 2015-07-30 DIAGNOSIS — K8500 Idiopathic acute pancreatitis without necrosis or infection: Secondary | ICD-10-CM

## 2015-07-30 MED ORDER — OXYCODONE HCL 5 MG PO TABS: 5.0000 mg | ORAL_TABLET | Freq: Four times a day (QID) | ORAL | Status: DC | PRN

## 2015-07-30 MED ORDER — PANCRELIPASE (LIP-PROT-AMYL) 12000-38000 UNITS PO CPEP: 24000.0000 [IU] | ORAL_CAPSULE | Freq: Three times a day (TID) | ORAL | Status: DC

## 2015-07-30 NOTE — Discharge Summary (Signed)
Physician Discharge Summary  Russell Carney MRN: [id number] DOB/AGE: 61/12/1955 61 y.o.  PCP: [name], MD   Admit date: 07/27/2015 Discharge date: 07/30/2015  Discharge Diagnoses:   Principal Problem:   Pancreatitis Active Problems:   HTN (hypertension)   Dyslipidemia   OSA (obstructive sleep apnea)   Acute pancreatitis    Follow-up recommendations Follow-up with PCP in 3-5 days , including all  additional recommended appointments as below Follow-up CBC, CMP in 3-5 days Outpatient follow-up with Dr. Oletta Lamas Outpatient evaluation for elective cholecystectomy Consideration for EUS as outpatient      Current Discharge Medication List    START taking these medications   Details  oxyCODONE (ROXICODONE) 5 MG immediate release tablet Take 1 tablet (5 mg total) by mouth every 6 (six) hours as needed for severe pain. Qty: 15 tablet, Refills: 0      CONTINUE these medications which have NOT CHANGED   Details  Ascorbic Acid (ACEROLA-C PO) Take 125 mg by mouth daily.    aspirin EC 81 MG tablet Take 81 mg by mouth every other day.    celecoxib (CELEBREX) 200 MG capsule Take 200 mg by mouth daily.    cholecalciferol (VITAMIN D) 1000 UNITS tablet Take 1,000 Units by mouth daily.    Coenzyme Q10 (COQ10) 100 MG CAPS Take 1 capsule by mouth daily.    folic acid (FOLVITE) 694 MCG tablet Take 400 mcg by mouth daily.    Glucosamine-Chondroit-Vit C-Mn (GLUCOSAMINE 1500 COMPLEX) CAPS Take 1 capsule by mouth daily.    Menthol, Topical Analgesic, (BLUE-EMU MAXIMUM STRENGTH EX) Apply 1 application topically daily as needed (knee pain).    Multiple Vitamin (MULTIVITAMIN WITH MINERALS) TABS tablet Take 1 tablet by mouth daily.    Olive Leaf Extract 500 MG CAPS Take 1 capsule by mouth daily.    Probiotic Product (PROBIOTIC DAILY) CAPS Take 1 capsule by mouth daily.    zolpidem (AMBIEN) 10 MG tablet Take 10 mg by mouth at bedtime as needed for sleep.      STOP taking these  medications     Digestive Enzymes (DIGESTIVE ENZYME PO)      ibuprofen (ADVIL,MOTRIN) 200 MG tablet      losartan (COZAAR) 25 MG tablet      simvastatin (ZOCOR) 20 MG tablet          Discharge Condition: Stable   Discharge Instructions Get Medicines reviewed and adjusted: Please take all your medications with you for your next visit with your Primary MD  Please request your Primary MD to go over all hospital tests and procedure/radiological results at the follow up, please ask your Primary MD to get all Hospital records sent to his/her office.  If you experience worsening of your admission symptoms, develop shortness of breath, life threatening emergency, suicidal or homicidal thoughts you must seek medical attention immediately by calling 911 or calling your MD immediately if symptoms less severe.  You must read complete instructions/literature along with all the possible adverse reactions/side effects for all the Medicines you take and that have been prescribed to you. Take any new Medicines after you have completely understood and accpet all the possible adverse reactions/side effects.   Do not drive when taking Pain medications.   Do not take more than prescribed Pain, Sleep and Anxiety Medications  Special Instructions: If you have smoked or chewed Tobacco in the last 2 yrs please stop smoking, stop any regular Alcohol and or any Recreational drug use.  Wear Seat belts while driving.  Please note  You were cared for by a hospitalist during your hospital stay. Once you are discharged, your primary care physician will handle any further medical issues. Please note that NO REFILLS for any discharge medications will be authorized once you are discharged, as it is imperative that you return to your primary care physician (or establish a relationship with a primary care physician if you do not have one) for your aftercare needs so that they can reassess your need for  medications and monitor your lab values.     Allergies  Allergen Reactions  . Lisinopril Cough      Disposition: 01-Home or Self Care   Consults:  Gastroenterology   Significant Diagnostic Studies:  Ct Abdomen Pelvis W Contrast  07/28/2015  CLINICAL DATA:  Bilateral and umbilical abdominal pain and nausea for 1 day. History of pancreatitis. EXAM: CT ABDOMEN AND PELVIS WITH CONTRAST TECHNIQUE: Multidetector CT imaging of the abdomen and pelvis was performed using the standard protocol following bolus administration of intravenous contrast. CONTRAST:  135m ISOVUE-300 IOPAMIDOL (ISOVUE-300) INJECTION 61% COMPARISON:  12/02/2014 FINDINGS: Atelectasis or infiltration in the lung bases. Diffuse fatty infiltration of the liver. Infiltration and edema around the pancreas, demonstrating progression since previous study. Fluid tracking along the pericolic gutters bilaterally. Changes are consistent with acute pancreatitis. Pancreatic parenchymal enhancement is homogeneous. No evidence of pancreatic necrosis. No developing loculated fluid collections. Portal veins and splenic veins appear patent. The gallbladder, spleen, adrenal glands, kidneys, abdominal aorta, inferior vena cava, and retroperitoneal lymph nodes are unremarkable. Stomach, small bowel, and colon are not abnormally distended. No free air in the abdomen. Pelvis: The appendix is normal. Diverticulosis of the sigmoid colon without evidence of diverticulitis. Bladder wall is not thickened. Prostate gland is not enlarged. IMPRESSION: Progression of inflammatory infiltration around the pancreas consistent with acute pancreatitis. No loculated fluid collections or pancreatic necrosis demonstrated. Electronically Signed   By: WLucienne CapersM.D.   On: 07/28/2015 04:42      Filed Weights   07/27/15 2355 07/28/15 0400  Weight: 95.255 kg (210 lb) 101.197 kg (223 lb 1.6 oz)     Microbiology: No results found for this or any previous  visit (from the past 240 hour(s)).     Blood Culture No results found for: SDES, SPECREQUEST, CULT, REPTSTATUS    Labs: Results for orders placed or performed during the hospital encounter of 07/27/15 (from the past 48 hour(s))  Comprehensive metabolic panel     Status: Abnormal   Collection Time: 07/29/15  4:11 AM  Result Value Ref Range   Sodium 137 135 - 145 mmol/L   Potassium 3.7 3.5 - 5.1 mmol/L    Comment: DELTA CHECK NOTED   Chloride 105 101 - 111 mmol/L   CO2 28 22 - 32 mmol/L   Glucose, Bld 95 65 - 99 mg/dL   BUN 10 6 - 20 mg/dL   Creatinine, Ser 0.65 0.61 - 1.24 mg/dL   Calcium 8.1 (L) 8.9 - 10.3 mg/dL   Total Protein 5.7 (L) 6.5 - 8.1 g/dL   Albumin 3.6 3.5 - 5.0 g/dL   AST 22 15 - 41 U/L   ALT 52 17 - 63 U/L   Alkaline Phosphatase 61 38 - 126 U/L   Total Bilirubin 1.2 0.3 - 1.2 mg/dL   GFR calc non Af Amer >60 >60 mL/min   GFR calc Af Amer >60 >60 mL/min    Comment: (NOTE) The eGFR has been calculated using the CKD EPI equation. This  calculation has not been validated in all clinical situations. eGFR's persistently <60 mL/min signify possible Chronic Kidney Disease.    Anion gap 4 (L) 5 - 15  CBC     Status: Abnormal   Collection Time: 07/29/15  4:11 AM  Result Value Ref Range   WBC 8.0 4.0 - 10.5 K/uL   RBC 4.64 4.22 - 5.81 MIL/uL   Hemoglobin 14.3 13.0 - 17.0 g/dL   HCT 41.8 39.0 - 52.0 %   MCV 90.1 78.0 - 100.0 fL   MCH 30.8 26.0 - 34.0 pg   MCHC 34.2 30.0 - 36.0 g/dL   RDW 13.1 11.5 - 15.5 %   Platelets 137 (L) 150 - 400 K/uL  Lipase, blood     Status: Abnormal   Collection Time: 07/29/15  4:11 AM  Result Value Ref Range   Lipase 200 (H) 11 - 51 U/L     Lipid Panel     Component Value Date/Time   CHOL 144 07/28/2015 0525   TRIG 68 07/28/2015 0525   HDL 35* 07/28/2015 0525   CHOLHDL 4.1 07/28/2015 0525   VLDL 14 07/28/2015 0525   LDLCALC 95 07/28/2015 0525     Lab Results  Component Value Date   HGBA1C 5.6 12/02/2014        Russell Carney is a 60 y.o. year old male who presented to the ED with abdominal pain and found to have a lipase greater than 4000. In Nov of last year, he presented to the hospital in [place] with a lipase in the 3000 range with evidence of acute uncomplicated pancreatitis via CT scan, AST 216, ALT 167, normal gallbladder. Consideration was raised for idiopathic vs medication effect (HCTZ). He was not seen by GI at that time. CT abd/pelvis with contrast this admission with acute pancreatitis but no evidence of complicating features. AST 108, ALT 89 on admission, now down to 62 and 82 respectively. Lipase has fallen to 1277.   First onset of similar type of pain was in July of last year, 2016. No elevation of lipase, CT at that time was normal. Similar symptoms in Nov, which prompted presentation to [facility], where he was found to have acute pancreatitis. Acute onset of upper abdominal pain last night around 8pm. Played golf yesterday and felt very tired. Onset was immediate. Associated nausea. Has an occasional crown royal on a Saturday night at dinner. Doesn't drink much but on vacation will have 2 glasses of wine. Gallbladder remains in situ. Takes "digestive enzymes" with lunch and dinner from O[facility]. No longer on HCTZ since Nov 2016  Hospital course Acute pancreatitis -Previous episode thought to be due to HCTZ; current episode could be from simvastatin or perhaps one of his many supplements -[place] in 11/16 showed normal GB and fatty liver; could consider repeat RUQ [place] if liver function becomes abnormal -CT abdomen pelvis shows progressive inflammatory infiltration around the pancreas consistent with acute pancreatitis Lipid panel showed LDL of 95 and triglycerides of 68 Received aggressive hydration and advance to a soft low-fat diet which he tolerated well Patient would need EUS as outpatient. If EUS is negative, would consider elective cholecystectomy at later date as outpatient.  Patient will follow with Dr. Oletta Lamas as an outpatient as he is currently established with the GI practice in [facility]   HTN  hold Cozaar until seen by primary care provider , patient at high risk of getting dehydrated as he is still recovering from his acute pancreatitis   HLD -  Holding simvastatin as it can cause pancreatitis Resume statin once pancreatitis resolves   OSA Continue CPAP, home setting = 4    Discharge Exam:   Blood pressure 125/79, pulse 83, temperature 99 F (37.2 C), temperature source Oral, resp. rate 20, height 5' 9"  (1.753 m), weight 101.197 kg (223 lb 1.6 oz), SpO2 97 %.   Heart: Regular rate and rhythm; no murmurs, clicks, rubs, or gallops. Abdomen: Soft, moderate TTP upper abdomen and nondistended. No masses, hepatosplenomegaly or hernias noted. Normal bowel sounds, without guarding, and without rebound.  Rectal: Deferred until time of colonoscopy.  Msk: Symmetrical without gross deformities. Normal posture. Extremities: Without edema. Neurologic: Alert and oriented x4 Psych: Alert and cooperative.      SignedReyne Dumas 07/30/2015, 8:33 AM        Time spent >45 mins

## 2015-08-06 ENCOUNTER — Other Ambulatory Visit: Payer: Self-pay | Admitting: Gastroenterology

## 2015-08-06 DIAGNOSIS — K859 Acute pancreatitis without necrosis or infection, unspecified: Secondary | ICD-10-CM

## 2015-08-08 ENCOUNTER — Ambulatory Visit
Admission: RE | Admit: 2015-08-08 | Discharge: 2015-08-08 | Disposition: A | Discharge status: Home / Self Care | Payer: Commercial Managed Care - HMO | Source: Ambulatory Visit | Attending: Gastroenterology | Admitting: Gastroenterology

## 2015-08-08 DIAGNOSIS — K859 Acute pancreatitis without necrosis or infection, unspecified: Secondary | ICD-10-CM

## 2015-08-14 ENCOUNTER — Other Ambulatory Visit: Payer: 59

## 2015-08-25 ENCOUNTER — Ambulatory Visit: Payer: Self-pay | Admitting: General Surgery

## 2015-08-25 NOTE — H&P (Signed)
Russell Carney 08/25/2015 10:00 AM Location: [facility] Patient #: [id number] DOB: November 05, 1955 Married / Language: Russell Carney / Race: White Male  History of Present Illness [name]; 08/25/2015 10:33 AM) The patient is a 60 year old male.   Note:He is referred by Dr. [name] for consultation regarding gallstone pancreatitis. He's had 2 episodes of acute pancreatitis recently. CT scan did not demonstrate any gallbladder disease. The first episode was felt to be secondary to Hydrochlorothiazide. He subsequently underwent an ultrasound which demonstrated multiple gallstones. He is here to discuss cholecystectomy. No family history of pancreatitis.  Other Problems Davy Pique Bynum, CMA; 08/25/2015 10:00 AM) Cholelithiasis Hepatitis High blood pressure Hypercholesterolemia Pancreatitis Sleep Apnea  Past Surgical History Davy Pique Bynum, CMA; 08/25/2015 10:00 AM) Colon Polyp Removal - Colonoscopy Knee Surgery Bilateral. Oral Surgery Spinal Surgery - Neck Tonsillectomy Vasectomy  Diagnostic Studies History Marjean Donna, CMA; 08/25/2015 10:00 AM) Colonoscopy 5-10 years ago  Allergies Davy Pique Bynum, CMA; 08/25/2015 10:01 AM) No Known Drug Allergies 08/25/2015  Medication History (Sonya Bynum, CMA; 08/25/2015 10:02 AM) Zolpidem Tartrate (10MG  Tablet, Oral) Active. Celecoxib (200MG  Capsule, Oral) Active. Losartan Potassium (25MG  Tablet, Oral) Active. Creon (12000UNIT Capsule DR Part, Oral) Active. Probiotic & Acidophilus Ex St (Oral) Active. Aspirin (81MG  Tablet Chewable, Oral) Active. Vitamin D (Cholecalciferol) (1000UNIT Capsule, Oral) Active. Folate-B12-Intrinsic Factor (800-500-20MCG-MCG-MG Tablet, Oral) Active. Medications Reconciled  Social History Marjean Donna, CMA; 08/25/2015 10:00 AM) Alcohol use Occasional alcohol use. Caffeine use Carbonated beverages, Coffee, Tea. No drug use Tobacco use Never smoker.  Family History ([place]; 08/25/2015 10:00 AM) Cervical Cancer Sister. Colon Cancer Father. Colon Polyps Brother, Father. Diabetes Mellitus Father. Heart Disease Father, Mother. Hypertension Sister. Thyroid problems Mother.     Review of Systems ([place]; 08/25/2015 10:00 AM) General Not Present- Appetite Loss, Chills, Fatigue, Fever, Night Sweats, Weight Gain and Weight Loss. Skin Not Present- Change in Wart/Mole, Dryness, Hives, Jaundice, New Lesions, Non-Healing Wounds, Rash and Ulcer. HEENT Not Present- Earache, Hearing Loss, Hoarseness, Nose Bleed, Oral Ulcers, Ringing in the Ears, Seasonal Allergies, Sinus Pain, Sore Throat, Visual Disturbances, Wears glasses/contact lenses and Yellow Eyes. Respiratory Not Present- Bloody sputum, Chronic Cough, Difficulty Breathing, Snoring and Wheezing. Breast Not Present- Breast Mass, Breast Pain, Nipple Discharge and Skin Changes. Cardiovascular Not Present- Chest Pain, Difficulty Breathing Lying Down, Leg Cramps, Palpitations, Rapid Heart Rate, Shortness of Breath and Swelling of Extremities. Gastrointestinal Not Present- Abdominal Pain, Bloating, Bloody Stool, Change in Bowel Habits, Chronic diarrhea, Constipation, Difficulty Swallowing, Excessive gas, Gets full quickly at meals, Hemorrhoids, Indigestion, Nausea, Rectal Pain and Vomiting. Male Genitourinary Not Present- Blood in Urine, Change in Urinary Stream, Frequency, Impotence, Nocturia, Painful Urination, Urgency and Urine Leakage. Musculoskeletal Not Present- Back Pain, Joint Pain, Joint Stiffness, Muscle Pain, Muscle Weakness and Swelling of Extremities. Neurological Not Present- Decreased Memory, Fainting, Headaches, Numbness, Seizures, Tingling, Tremor, Trouble walking and Weakness. Psychiatric Not Present- Anxiety, Bipolar, Change in Sleep Pattern, Depression, Fearful and Frequent crying. Endocrine Not Present- Cold Intolerance, Excessive Hunger, Hair Changes, Heat Intolerance, Hot flashes  and New Diabetes. Hematology Not Present- Blood Thinners, Easy Bruising, Excessive bleeding, Gland problems, HIV and Persistent Infections.  Vitals (Sonya Bynum CMA; 08/25/2015 10:01 AM) 08/25/2015 10:00 AM Weight: 217 lb Height: 69in Body Surface Area: 2.14 m Body Mass Index: 32.04 kg/m  Temp.: 99.55F(Temporal)  Pulse: 78 (Regular)  BP: 126/76 (Sitting, Left Arm, Standard)      Physical Exam [name]; 08/25/2015 10:35 AM)  The physical exam  findings are as follows: Note:General: Obese male in NAD. Pleasant and cooperative.  HEENT: [place]/AT, no external nasal or ear masses, mucous membranes are moist  EYES: no scleral icterus, pupils normal  NECK: Supple, scar present  CV: RRR, no murmur, no edema  CHEST: Breath sounds equal and clear. Respirations nonlabored.  ABDOMEN: Soft, nontender, nondistended, no masses, no organomegaly, active bowel sounds, no scars, no hernias.  MUSCULOSKELETAL: FROM, good muscle tone, normal station and gait  LYMPHATIC: No palpable cervical adenopathy  SKIN: No jaundice.  NEUROLOGIC: Alert and oriented, answers questions appropriately, normal gait and station.  PSYCHIATRIC: Normal mood, affect , and behavior.    Assessment & Plan [name]; 08/25/2015 10:35 AM)  [name] PANCREATITIS (K85.10) Impression: Currently symptomatically. I recommended cholecystectomy and he is in agreement with this.  Plan: Laparoscopic cholecystectomy with cholangiogram. Low-fat diet. I have explained the procedure, risks, and aftercare of cholecystectomy. Risks include but are not limited to bleeding, infection, wound problems, anesthesia, diarrhea, bile leak, injury to common bile duct/liver/intestine. He seems to understand and agrees to proceed.  [name], MD

## 2015-09-12 ENCOUNTER — Encounter (HOSPITAL_COMMUNITY): Payer: Self-pay

## 2015-09-12 ENCOUNTER — Encounter (HOSPITAL_COMMUNITY)
Admission: RE | Admit: 2015-09-12 | Discharge: 2015-09-12 | Disposition: A | Discharge status: Home / Self Care | Payer: Commercial Managed Care - HMO | Source: Ambulatory Visit | Attending: General Surgery | Admitting: General Surgery

## 2015-09-12 DIAGNOSIS — Z01812 Encounter for preprocedural laboratory examination: Secondary | ICD-10-CM | POA: Diagnosis not present

## 2015-09-12 LAB — CBC
HCT: 46.5 % (ref 39.0–52.0)
Hemoglobin: 16.1 g/dL (ref 13.0–17.0)
MCH: 31.1 pg (ref 26.0–34.0)
MCHC: 34.6 g/dL (ref 30.0–36.0)
MCV: 89.8 fL (ref 78.0–100.0)
PLATELETS: 178 10*3/uL (ref 150–400)
RBC: 5.18 MIL/uL (ref 4.22–5.81)
RDW: 13 % (ref 11.5–15.5)
WBC: 6.2 10*3/uL (ref 4.0–10.5)

## 2015-09-12 LAB — COMPREHENSIVE METABOLIC PANEL
ALK PHOS: 64 U/L (ref 38–126)
ALT: 24 U/L (ref 17–63)
AST: 22 U/L (ref 15–41)
Albumin: 4.4 g/dL (ref 3.5–5.0)
Anion gap: 9 (ref 5–15)
BILIRUBIN TOTAL: 0.7 mg/dL (ref 0.3–1.2)
BUN: 13 mg/dL (ref 6–20)
CALCIUM: 9.2 mg/dL (ref 8.9–10.3)
CO2: 25 mmol/L (ref 22–32)
CREATININE: 1.2 mg/dL (ref 0.61–1.24)
Chloride: 105 mmol/L (ref 101–111)
GFR calc non Af Amer: >60 mL/min (ref >60)
Glucose, Bld: 124 mg/dL — ABNORMAL HIGH (ref 65–99)
Potassium: 4 mmol/L (ref 3.5–5.1)
SODIUM: 139 mmol/L (ref 135–145)
TOTAL PROTEIN: 6.6 g/dL (ref 6.5–8.1)

## 2015-09-12 NOTE — Progress Notes (Signed)
PCP - [place] Cardiologist - [place]  Chest x-ray - not needed EKG - 07/28/15 Stress Test - requesting ECHO - requesting Cardiac Cath - denies  Sleep study 10/17/14    Patient denies shortness of breath, fever, cough and chest pain at PAT appointment

## 2015-09-12 NOTE — Pre-Procedure Instructions (Signed)
Russell Carney  09/12/2015      [id number] - [place], [place] [address] Phone: [phone] Fax: [phone]    Your procedure is scheduled on September 1  Report to [facility] at [place].M.  Call this number if you have problems the morning of surgery:  [phone]   Remember:  Do not eat food or drink liquids after midnight.   Take these medicines the morning of surgery with A SIP OF WATER celecoxib (celebrex), oxycodone (roxicodone),  7 days prior to surgery STOP taking any Aspirin, Aleve, Naproxen, Ibuprofen, Motrin, Advil, Goody's, BC's, all herbal medications, fish oil, and all vitamins    Do not wear jewelry, make-up or nail polish.  Do not wear lotions, powders, or perfumes, or deoderant.  Men may shave face and neck.  Do not bring valuables to the hospital.  [facility] is not responsible for any belongings or valuables.  Contacts, dentures or bridgework may not be worn into surgery.  Leave your suitcase in the car.  After surgery it may be brought to your room.  For patients admitted to the hospital, discharge time will be determined by your treatment team.  Patients discharged the day of surgery will not be allowed to drive home.    Special instructions:   [place]- Preparing For Surgery  Before surgery, you can play an important role. Because skin is not sterile, your skin needs to be as free of germs as possible. You can reduce the number of germs on your skin by washing with CHG (chlorahexidine gluconate) Soap before surgery.  CHG is an antiseptic cleaner which kills germs and bonds with the skin to continue killing germs even after washing.  Please do not use if you have an allergy to CHG or antibacterial soaps. If your skin becomes reddened/irritated stop using the CHG.  Do not shave (including legs and underarms) for at least 48 hours prior to first CHG shower. It is  OK to shave your face.  Please follow these instructions carefully.   1. Shower the NIGHT BEFORE SURGERY and the MORNING OF SURGERY with CHG.   2. If you chose to wash your hair, wash your hair first as usual with your normal shampoo.  3. After you shampoo, rinse your hair and body thoroughly to remove the shampoo.  4. Use CHG as you would any other liquid soap. You can apply CHG directly to the skin and wash gently with a scrungie or a clean washcloth.   5. Apply the CHG Soap to your body ONLY FROM THE NECK DOWN.  Do not use on open wounds or open sores. Avoid contact with your eyes, ears, mouth and genitals (private parts). Wash genitals (private parts) with your normal soap.  6. Wash thoroughly, paying special attention to the area where your surgery will be performed.  7. Thoroughly rinse your body with warm water from the neck down.  8. DO NOT shower/wash with your normal soap after using and rinsing off the CHG Soap.  9. Pat yourself dry with a CLEAN TOWEL.   10. Wear CLEAN PAJAMAS   11. Place CLEAN SHEETS on your bed the night of your first shower and DO NOT SLEEP WITH PETS.    Day of Surgery: Do not apply any deodorants/lotions. Please wear clean clothes to the hospital/surgery center.      Please read over the following fact sheets that you  were given. Pain Booklet, Coughing and Deep Breathing and Surgical Site Infection Prevention

## 2015-09-15 NOTE — Progress Notes (Signed)
Anesthesia Chart Review:  Pt is a 60 year old male scheduled for laparoscopic cholecystectomy with intraoperative cholangiogram on 09/19/2015 with [name], MD.   - PCP is Dibas Dorthy Cooler, MD.  - Saw cardiologist is Kela Millin, MD, last office visit 10/11/14, for pre-op eval; instructed to f/u prn.   PMH includes:  HTN, OSA, hepatitis A. Never smoker. BMI 32. S/p ACDF 04/24/14.   Medications include: ASA, losartan, simvastatin  Preoperative labs reviewed.    chest X-ray 12/02/14: No acute disease.  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 RBBB and LAFB  Exercise treadmill test 09/27/14: No ST-T changes of ischemia. No arrhythmias. Excellent effort. Stress terminated due to [facility] met.   If no changes, I anticipate pt can proceed with surgery as scheduled.   Willeen Cass, FNP-BC [facility]/Anesthesiology Phone: [phone] 09/15/2015 3:29 PM

## 2015-09-18 MED ORDER — CEFAZOLIN SODIUM-DEXTROSE 2-4 GM/100ML-% IV SOLN
2.0000 g | INTRAVENOUS | Status: AC
  Administered 2015-09-19: 2 g via INTRAVENOUS
  Filled 2015-09-18: qty 100

## 2015-09-19 ENCOUNTER — Ambulatory Visit (HOSPITAL_COMMUNITY): Payer: Commercial Managed Care - HMO | Admitting: Certified Registered"

## 2015-09-19 ENCOUNTER — Ambulatory Visit (HOSPITAL_COMMUNITY): Payer: Commercial Managed Care - HMO

## 2015-09-19 ENCOUNTER — Ambulatory Visit (HOSPITAL_COMMUNITY): Payer: Commercial Managed Care - HMO | Admitting: Emergency Medicine

## 2015-09-19 ENCOUNTER — Encounter (HOSPITAL_COMMUNITY): Payer: Self-pay | Admitting: Surgery

## 2015-09-19 ENCOUNTER — Ambulatory Visit (HOSPITAL_COMMUNITY)
Admission: RE | Admit: 2015-09-19 | Discharge: 2015-09-19 | Disposition: A | Discharge status: Home / Self Care | Payer: Commercial Managed Care - HMO | Source: Ambulatory Visit | Attending: General Surgery | Admitting: General Surgery

## 2015-09-19 ENCOUNTER — Encounter (HOSPITAL_COMMUNITY)
Admission: RE | Disposition: A | Discharge status: Home / Self Care | Payer: Self-pay | Source: Ambulatory Visit | Attending: General Surgery

## 2015-09-19 DIAGNOSIS — I10 Essential (primary) hypertension: Secondary | ICD-10-CM | POA: Diagnosis not present

## 2015-09-19 DIAGNOSIS — E7800 Pure hypercholesterolemia, unspecified: Secondary | ICD-10-CM | POA: Diagnosis not present

## 2015-09-19 DIAGNOSIS — K8010 Calculus of gallbladder with chronic cholecystitis without obstruction: Secondary | ICD-10-CM | POA: Insufficient documentation

## 2015-09-19 DIAGNOSIS — M1990 Unspecified osteoarthritis, unspecified site: Secondary | ICD-10-CM | POA: Insufficient documentation

## 2015-09-19 DIAGNOSIS — G4730 Sleep apnea, unspecified: Secondary | ICD-10-CM | POA: Diagnosis not present

## 2015-09-19 DIAGNOSIS — Z419 Encounter for procedure for purposes other than remedying health state, unspecified: Secondary | ICD-10-CM

## 2015-09-19 HISTORY — PX: CHOLECYSTECTOMY: SHX55

## 2015-09-19 SURGERY — LAPAROSCOPIC CHOLECYSTECTOMY WITH INTRAOPERATIVE CHOLANGIOGRAM
Anesthesia: General | Site: Abdomen

## 2015-09-19 MED ORDER — SUGAMMADEX SODIUM 500 MG/5ML IV SOLN
INTRAVENOUS | Status: AC
  Filled 2015-09-19: qty 5

## 2015-09-19 MED ORDER — EPHEDRINE SULFATE 50 MG/ML IJ SOLN
INTRAMUSCULAR | Status: DC | PRN
  Administered 2015-09-19: 10 mg via INTRAVENOUS

## 2015-09-19 MED ORDER — ACETAMINOPHEN 10 MG/ML IV SOLN
INTRAVENOUS | Status: DC | PRN
  Administered 2015-09-19: 1000 mg via INTRAVENOUS

## 2015-09-19 MED ORDER — LIDOCAINE 2% (20 MG/ML) 5 ML SYRINGE
INTRAMUSCULAR | Status: AC
  Filled 2015-09-19: qty 5

## 2015-09-19 MED ORDER — LACTATED RINGERS IV SOLN
INTRAVENOUS | Status: DC | PRN
  Administered 2015-09-19 (×2): via INTRAVENOUS

## 2015-09-19 MED ORDER — FENTANYL CITRATE (PF) 100 MCG/2ML IJ SOLN
INTRAMUSCULAR | Status: AC
  Filled 2015-09-19: qty 4

## 2015-09-19 MED ORDER — 0.9 % SODIUM CHLORIDE (POUR BTL) OPTIME
TOPICAL | Status: DC | PRN
Start: 2015-09-19 — End: 2015-09-19
  Administered 2015-09-19: 1000 mL

## 2015-09-19 MED ORDER — SODIUM CHLORIDE 0.9% FLUSH: 3.0000 mL | INTRAVENOUS | Status: DC | PRN

## 2015-09-19 MED ORDER — ACETAMINOPHEN 650 MG RE SUPP: 650.0000 mg | RECTAL | Status: DC | PRN

## 2015-09-19 MED ORDER — OXYCODONE HCL 5 MG PO TABS: 5.0000 mg | ORAL_TABLET | ORAL | 0 refills | Status: AC | PRN

## 2015-09-19 MED ORDER — PROPOFOL 10 MG/ML IV BOLUS
INTRAVENOUS | Status: AC
  Filled 2015-09-19: qty 20

## 2015-09-19 MED ORDER — HEMOSTATIC AGENTS (NO CHARGE) OPTIME
TOPICAL | Status: DC | PRN
  Administered 2015-09-19: 1 via TOPICAL

## 2015-09-19 MED ORDER — ACETAMINOPHEN 325 MG PO TABS: 650.0000 mg | ORAL_TABLET | ORAL | Status: DC | PRN

## 2015-09-19 MED ORDER — SODIUM CHLORIDE 0.9 % IR SOLN
Status: DC | PRN
  Administered 2015-09-19: 1000 mL

## 2015-09-19 MED ORDER — BUPIVACAINE-EPINEPHRINE 0.25% -1:200000 IJ SOLN
INTRAMUSCULAR | Status: DC | PRN
  Administered 2015-09-19: 15 mL

## 2015-09-19 MED ORDER — SODIUM CHLORIDE 0.9 % IV SOLN
INTRAVENOUS | Status: DC | PRN
  Administered 2015-09-19: 7 mL

## 2015-09-19 MED ORDER — OXYCODONE HCL 5 MG PO TABS
ORAL_TABLET | ORAL | Status: AC
  Filled 2015-09-19: qty 1

## 2015-09-19 MED ORDER — CHLORHEXIDINE GLUCONATE CLOTH 2 % EX PADS: 6.0000 | MEDICATED_PAD | Freq: Once | CUTANEOUS | Status: DC

## 2015-09-19 MED ORDER — DEXAMETHASONE SODIUM PHOSPHATE 10 MG/ML IJ SOLN
INTRAMUSCULAR | Status: AC
  Filled 2015-09-19: qty 1

## 2015-09-19 MED ORDER — ONDANSETRON HCL 4 MG PO TABS: 4.0000 mg | ORAL_TABLET | ORAL | 0 refills | Status: DC | PRN

## 2015-09-19 MED ORDER — FENTANYL CITRATE (PF) 100 MCG/2ML IJ SOLN
INTRAMUSCULAR | Status: DC | PRN
  Administered 2015-09-19: 50 ug via INTRAVENOUS
  Administered 2015-09-19: 100 ug via INTRAVENOUS

## 2015-09-19 MED ORDER — PHENYLEPHRINE HCL 10 MG/ML IJ SOLN
INTRAMUSCULAR | Status: DC | PRN
  Administered 2015-09-19 (×3): 120 ug via INTRAVENOUS

## 2015-09-19 MED ORDER — DEXAMETHASONE SODIUM PHOSPHATE 4 MG/ML IJ SOLN
INTRAMUSCULAR | Status: DC | PRN
  Administered 2015-09-19: 10 mg via INTRAVENOUS

## 2015-09-19 MED ORDER — GLYCOPYRROLATE 0.2 MG/ML IV SOSY
PREFILLED_SYRINGE | INTRAVENOUS | Status: AC
  Filled 2015-09-19: qty 3

## 2015-09-19 MED ORDER — LIDOCAINE HCL (CARDIAC) 20 MG/ML IV SOLN
INTRAVENOUS | Status: DC | PRN
  Administered 2015-09-19: 80 mg via INTRAVENOUS

## 2015-09-19 MED ORDER — ONDANSETRON HCL 4 MG/2ML IJ SOLN
INTRAMUSCULAR | Status: DC | PRN
  Administered 2015-09-19: 4 mg via INTRAVENOUS

## 2015-09-19 MED ORDER — MORPHINE SULFATE (PF) 2 MG/ML IV SOLN
INTRAVENOUS | Status: AC
  Filled 2015-09-19: qty 1

## 2015-09-19 MED ORDER — BUPIVACAINE-EPINEPHRINE (PF) 0.25% -1:200000 IJ SOLN
INTRAMUSCULAR | Status: AC
  Filled 2015-09-19: qty 30

## 2015-09-19 MED ORDER — MIDAZOLAM HCL 2 MG/2ML IJ SOLN
INTRAMUSCULAR | Status: AC
  Filled 2015-09-19: qty 2

## 2015-09-19 MED ORDER — SUGAMMADEX SODIUM 200 MG/2ML IV SOLN
INTRAVENOUS | Status: DC | PRN
  Administered 2015-09-19: 400 mg via INTRAVENOUS

## 2015-09-19 MED ORDER — SUCCINYLCHOLINE CHLORIDE 20 MG/ML IJ SOLN
INTRAMUSCULAR | Status: DC | PRN
  Administered 2015-09-19: 100 mg via INTRAVENOUS

## 2015-09-19 MED ORDER — OXYCODONE HCL 5 MG PO TABS
5.0000 mg | ORAL_TABLET | ORAL | Status: DC | PRN
  Administered 2015-09-19: 5 mg via ORAL

## 2015-09-19 MED ORDER — IOPAMIDOL (ISOVUE-300) INJECTION 61%
INTRAVENOUS | Status: AC
  Filled 2015-09-19: qty 50

## 2015-09-19 MED ORDER — ROCURONIUM BROMIDE 100 MG/10ML IV SOLN
INTRAVENOUS | Status: DC | PRN
  Administered 2015-09-19: 10 mg via INTRAVENOUS
  Administered 2015-09-19: 50 mg via INTRAVENOUS

## 2015-09-19 MED ORDER — PHENYLEPHRINE 40 MCG/ML (10ML) SYRINGE FOR IV PUSH (FOR BLOOD PRESSURE SUPPORT)
PREFILLED_SYRINGE | INTRAVENOUS | Status: AC
  Filled 2015-09-19: qty 10

## 2015-09-19 MED ORDER — ONDANSETRON HCL 4 MG/2ML IJ SOLN
INTRAMUSCULAR | Status: AC
  Filled 2015-09-19: qty 2

## 2015-09-19 MED ORDER — SUCCINYLCHOLINE CHLORIDE 200 MG/10ML IV SOSY
PREFILLED_SYRINGE | INTRAVENOUS | Status: AC
  Filled 2015-09-19: qty 10

## 2015-09-19 MED ORDER — MIDAZOLAM HCL 5 MG/5ML IJ SOLN
INTRAMUSCULAR | Status: DC | PRN
  Administered 2015-09-19: 2 mg via INTRAVENOUS

## 2015-09-19 MED ORDER — MORPHINE SULFATE (PF) 2 MG/ML IV SOLN
2.0000 mg | INTRAVENOUS | Status: DC | PRN
  Administered 2015-09-19: 2 mg via INTRAVENOUS

## 2015-09-19 MED ORDER — ACETAMINOPHEN 10 MG/ML IV SOLN
INTRAVENOUS | Status: AC
  Filled 2015-09-19: qty 100

## 2015-09-19 MED ORDER — ROCURONIUM BROMIDE 10 MG/ML (PF) SYRINGE
PREFILLED_SYRINGE | INTRAVENOUS | Status: AC
  Filled 2015-09-19: qty 10

## 2015-09-19 MED ORDER — PROPOFOL 10 MG/ML IV BOLUS
INTRAVENOUS | Status: DC | PRN
  Administered 2015-09-19: 200 mg via INTRAVENOUS

## 2015-09-19 SURGICAL SUPPLY — 54 items
APL SKNCLS STERI-STRIP NONHPOA (GAUZE/BANDAGES/DRESSINGS) ×1
APPLIER CLIP 5 13 M/L LIGAMAX5 (MISCELLANEOUS) ×2
APR CLP MED LRG 5 ANG JAW (MISCELLANEOUS) ×1
BAG SPEC RTRVL LRG 6X4 10 (ENDOMECHANICALS) ×1
BENZOIN TINCTURE PRP APPL 2/3 (GAUZE/BANDAGES/DRESSINGS) ×2 IMPLANT
CANISTER SUCTION 2500CC (MISCELLANEOUS) ×2 IMPLANT
CATH REDDICK CHOLANGI 4FR 50CM (CATHETERS) ×2 IMPLANT
CHLORAPREP W/TINT 26ML (MISCELLANEOUS) ×2 IMPLANT
CLIP APPLIE 5 13 M/L LIGAMAX5 (MISCELLANEOUS) ×1 IMPLANT
COVER MAYO STAND STRL (DRAPES) ×2 IMPLANT
COVER SURGICAL LIGHT HANDLE (MISCELLANEOUS) ×2 IMPLANT
DECANTER SPIKE VIAL GLASS SM (MISCELLANEOUS) ×2 IMPLANT
DRAPE C-ARM 42X72 X-RAY (DRAPES) ×2 IMPLANT
DRSG TEGADERM 2-3/8X2-3/4 SM (GAUZE/BANDAGES/DRESSINGS) ×8 IMPLANT
ELECT REM PT RETURN 9FT ADLT (ELECTROSURGICAL) ×2
ELECTRODE REM PT RTRN 9FT ADLT (ELECTROSURGICAL) ×1 IMPLANT
GAUZE SPONGE 2X2 8PLY STRL LF (GAUZE/BANDAGES/DRESSINGS) ×1 IMPLANT
GLOVE BIO SURGEON STRL SZ7 (GLOVE) ×1 IMPLANT
GLOVE BIO SURGEON STRL SZ8 (GLOVE) ×1 IMPLANT
GLOVE BIOGEL PI IND STRL 6.5 (GLOVE) IMPLANT
GLOVE BIOGEL PI IND STRL 7.0 (GLOVE) IMPLANT
GLOVE BIOGEL PI IND STRL 8 (GLOVE) ×1 IMPLANT
GLOVE BIOGEL PI IND STRL 8.5 (GLOVE) IMPLANT
GLOVE BIOGEL PI INDICATOR 6.5 (GLOVE) ×1
GLOVE BIOGEL PI INDICATOR 7.0 (GLOVE) ×1
GLOVE BIOGEL PI INDICATOR 8 (GLOVE) ×1
GLOVE BIOGEL PI INDICATOR 8.5 (GLOVE) ×1
GLOVE ECLIPSE 6.0 STRL STRAW (GLOVE) ×1 IMPLANT
GLOVE ECLIPSE 8.0 STRL XLNG CF (GLOVE) ×2 IMPLANT
GLOVE EUDERMIC 7 POWDERFREE (GLOVE) ×1 IMPLANT
GOWN STRL REUS W/ TWL LRG LVL3 (GOWN DISPOSABLE) ×3 IMPLANT
GOWN STRL REUS W/ TWL XL LVL3 (GOWN DISPOSABLE) IMPLANT
GOWN STRL REUS W/TWL LRG LVL3 (GOWN DISPOSABLE) ×6
GOWN STRL REUS W/TWL XL LVL3 (GOWN DISPOSABLE) ×4
IV CATH 14GX2 1/4 (CATHETERS) ×2 IMPLANT
KIT BASIN OR (CUSTOM PROCEDURE TRAY) ×2 IMPLANT
KIT ROOM TURNOVER OR (KITS) ×2 IMPLANT
NS IRRIG 1000ML POUR BTL (IV SOLUTION) ×2 IMPLANT
PAD ARMBOARD 7.5X6 YLW CONV (MISCELLANEOUS) ×2 IMPLANT
POUCH SPECIMEN RETRIEVAL 10MM (ENDOMECHANICALS) ×2 IMPLANT
SCISSORS LAP 5X35 DISP (ENDOMECHANICALS) ×2 IMPLANT
SET IRRIG TUBING LAPAROSCOPIC (IRRIGATION / IRRIGATOR) ×2 IMPLANT
SLEEVE ENDOPATH XCEL 5M (ENDOMECHANICALS) ×4 IMPLANT
SPECIMEN JAR SMALL (MISCELLANEOUS) ×2 IMPLANT
SPONGE GAUZE 2X2 STER 10/PKG (GAUZE/BANDAGES/DRESSINGS) ×1
STRIP CLOSURE SKIN 1/2X4 (GAUZE/BANDAGES/DRESSINGS) ×2 IMPLANT
SUT MON AB 4-0 PC3 18 (SUTURE) ×2 IMPLANT
TOWEL OR 17X24 6PK STRL BLUE (TOWEL DISPOSABLE) ×2 IMPLANT
TOWEL OR 17X26 10 PK STRL BLUE (TOWEL DISPOSABLE) ×2 IMPLANT
TRAY LAPAROSCOPIC MC (CUSTOM PROCEDURE TRAY) ×2 IMPLANT
TROCAR XCEL BLUNT TIP 100MML (ENDOMECHANICALS) ×2 IMPLANT
TROCAR XCEL NON-BLD 11X100MML (ENDOMECHANICALS) IMPLANT
TROCAR XCEL NON-BLD 5MMX100MML (ENDOMECHANICALS) ×2 IMPLANT
TUBING INSUFFLATION (TUBING) ×2 IMPLANT

## 2015-09-19 NOTE — Discharge Instructions (Addendum)
LAPAROSCOPIC SURGERY: POST OP INSTRUCTIONS  1. DIET: Follow a liquid diet the first day after arrival home, such as soup, liquids, crackers, etc.  Be sure to include lots of fluids daily.  Avoid fast food or heavy meals as your are more likely to get nauseated.  Eat a low fat beginning 2 days after surgery.   2. Take your usually prescribed home medications unless otherwise directed. 3. PAIN CONTROL: a. Pain is best controlled by a usual combination of three different methods TOGETHER: i. Ice/Heat ii. Over the counter pain medication iii. Prescription pain medication b. Most patients will experience some swelling and bruising around the incisions.  Ice packs or heating pads (30-60 minutes up to 6 times a day) will help. Use ice for the first few days to help decrease swelling and bruising, then switch to heat to help relax tight/sore spots and speed recovery.  Some people prefer to use ice alone, heat alone, alternating between ice & heat.  Experiment to what works for you.  Swelling and bruising can take several weeks to resolve.   c. It is helpful to take an over-the-counter pain medication regularly for the first few weeks.  Choose one of the following that works best for you: i. Naproxen (Aleve, etc)  Two 220mg  tabs twice a day ii. Ibuprofen (Advil, etc) Three 200mg  tabs four times a day (every meal & bedtime) iii. Acetaminophen (Tylenol, etc) 500-650mg  four times a day (every meal & bedtime) d. A  prescription for pain medication (such as oxycodone, hydrocodone, etc) should be given to you upon discharge.  Take your pain medication as prescribed.  i. If you are having problems/concerns with the prescription medicine (does not control pain, nausea, vomiting, rash, itching, etc), please call us [phone] to see if we need to switch you to a different pain medicine that will work better for you and/or control your side effect better. ii. If you need a refill on your pain medication, please  contact your pharmacy.  They will contact our office to request authorization. Prescriptions will not be filled after 5 pm or on week-ends. 4. Avoid getting constipated.  Between the surgery and the pain medications, it is common to experience some constipation.  Increasing fluid intake and taking a fiber supplement (such as Metamucil, Citrucel, FiberCon, MiraLax, etc) 1-2 times a day regularly will usually help prevent this problem from occurring.  A mild laxative (prune juice, Milk of Magnesia, MiraLax, etc) should be taken according to package directions if there are no bowel movements after 48 hours.   5. Watch out for diarrhea.  If you have many loose bowel movements, simplify your diet to bland foods & liquids for a few days.  Stop any stool softeners and decrease your fiber supplement.  Switching to mild anti-diarrheal medications (Kayopectate, Pepto Bismol) can help.  If this worsens or does not improve, please call us. 6. Wash / shower every day.  You may shower over the dressings as they are waterproof.  Continue to shower over incision(s) after the dressing is off. 7. Remove your waterproof bandages 3 days after surgery.  You may leave the incision open to air.  You may replace a dressing/Band-Aid to cover the incision for comfort if you wish.  8. ACTIVITIES as tolerated:   a. You may resume regular (light) daily activities beginning the next day--such as daily self-care, walking, climbing stairs--gradually increasing light activities as tolerated.  No heavy lifting (over 10 pounds), straining, or intense activities for  2 weeks. b. DO NOT PUSH THROUGH PAIN.  Let pain be your guide: If it hurts to do something, don't do it.  Pain is your body warning you to avoid that activity for another week until the pain goes down. c. You may drive when you are no longer taking prescription pain medication, you can comfortably wear a seatbelt, and you can safely maneuver your car and apply brakes. d. Dennis Bast may  have sexual intercourse when it is comfortable.  9. FOLLOW UP in our office a. Please call [facility] at [phone] to set up an appointment to see your surgeon in the office for a follow-up appointment approximately 2-3 weeks after your surgery. b. Make sure that you call for this appointment the day you arrive home to insure a convenient appointment time. 10. IF YOU HAVE DISABILITY OR FAMILY LEAVE FORMS, BRING THEM TO THE OFFICE FOR PROCESSING.  DO NOT GIVE THEM TO YOUR DOCTOR.  11.  Return to work/school:  Desk work/light activities in 5-7 days, full duty/activities in 2 weeks if pain-free.  12.  Restart Aspirin on Monday, 09/22/15.   WHEN TO CALL us [phone]: 1. Poor pain control 2. Reactions / problems with new medications (rash/itching, nausea, etc)  3. Fever over 101.5 F (38.5 C) 4. Inability to urinate 5. Nausea and/or vomiting 6. Worsening swelling or bruising 7. Continued bleeding from incision. 8. Increased pain, redness, or drainage from the incision   The clinic staff is available to answer your questions during regular business hours (8:30am-5pm).  Please dont hesitate to call and ask to speak to one of our nurses for clinical concerns.   If you have a medical emergency, go to the nearest emergency room or call 911.  A surgeon from [facility] is always on call at the [facility], [place], [place], [place], [place]  [address] ? MAIN: [phone] ? TOLL FREE: [phone] ?  FAX [phone] [email or web address]

## 2015-09-19 NOTE — Interval H&P Note (Signed)
History and Physical Interval Note:  09/19/2015 7:15 AM  Russell Carney  has presented today for surgery, with the diagnosis of Gallstone pancreatitis  The various methods of treatment have been discussed with the patient and family. After consideration of risks, benefits and other options for treatment, the patient has consented to  Procedure(s): LAPAROSCOPIC CHOLECYSTECTOMY WITH INTRAOPERATIVE CHOLANGIOGRAM (N/A) as a surgical intervention .  The patient's history has been reviewed, patient examined, no change in status, stable for surgery.  I have reviewed the patient's chart and labs.  Questions were answered to the patient's satisfaction.     Tenoch Mcclure Lenna Sciara

## 2015-09-19 NOTE — Transfer of Care (Signed)
Immediate Anesthesia Transfer of Care Note  Patient: Russell Carney  Procedure(s) Performed: Procedure(s): LAPAROSCOPIC CHOLECYSTECTOMY WITH INTRAOPERATIVE CHOLANGIOGRAM (N/A)  Patient Location: PACU  Anesthesia Type:General  Level of Consciousness: sedated  Airway & Oxygen Therapy: Patient Spontanous Breathing and Patient connected to face mask oxygen  Post-op Assessment: Report given to RN and Post -op Vital signs reviewed and stable  Post vital signs: Reviewed and stable  Last Vitals:  Vitals:   09/19/15 0603 09/19/15 0910  BP: 139/80   Pulse: 67 70  Resp: 18 14  Temp: 36.7 C 36.4 C    Last Pain:  Vitals:   09/19/15 0603  TempSrc: Oral         Complications: No apparent anesthesia complications

## 2015-09-19 NOTE — Anesthesia Preprocedure Evaluation (Addendum)
Anesthesia Evaluation  Patient identified by MRN, date of birth, ID band Patient awake    Reviewed: Allergy & Precautions, NPO status , Patient's Chart, lab work & pertinent test results  Airway Mallampati: II  TM Distance: <3 FB Neck ROM: Full    Dental  (+) Teeth Intact, Dental Advisory Given   Pulmonary sleep apnea ,    breath sounds clear to auscultation       Cardiovascular hypertension,  Rhythm:Regular     Neuro/Psych    GI/Hepatic (+) Hepatitis -Gallbladder   Endo/Other    Renal/GU      Musculoskeletal  (+) Arthritis , Neck pain    Abdominal (+) + obese,   Peds  Hematology   Anesthesia Other Findings   Reproductive/Obstetrics                            Anesthesia Physical Anesthesia Plan  ASA: II  Anesthesia Plan: General   Post-op Pain Management:    Induction: Intravenous  Airway Management Planned: Oral ETT and Video Laryngoscope Planned  Additional Equipment:   Intra-op Plan:   Post-operative Plan: Extubation in OR  Informed Consent: I have reviewed the patients History and Physical, chart, labs and discussed the procedure including the risks, benefits and alternatives for the proposed anesthesia with the patient or authorized representative who has indicated his/her understanding and acceptance.   Dental advisory given  Plan Discussed with: CRNA  Anesthesia Plan Comments:        Anesthesia Quick Evaluation

## 2015-09-19 NOTE — Anesthesia Postprocedure Evaluation (Signed)
Anesthesia Post Note  Patient: Russell Carney  Procedure(s) Performed: Procedure(s) (LRB): LAPAROSCOPIC CHOLECYSTECTOMY WITH INTRAOPERATIVE CHOLANGIOGRAM (N/A)  Patient location during evaluation: PACU Anesthesia Type: General Level of consciousness: awake and alert Pain management: pain level controlled Vital Signs Assessment: post-procedure vital signs reviewed and stable Respiratory status: spontaneous breathing, nonlabored ventilation, respiratory function stable and patient connected to nasal cannula oxygen Cardiovascular status: blood pressure returned to baseline and stable Postop Assessment: no signs of nausea or vomiting Anesthetic complications: no    Last Vitals:  Vitals:   09/19/15 1037 09/19/15 1048  BP: 109/72 114/61  Pulse: 60 (!) 55  Resp: 20 18  Temp:      Last Pain:  Vitals:   09/19/15 1037  TempSrc:   PainSc: 3                  Arnetta Odeh,JAMES TERRILL

## 2015-09-19 NOTE — H&P (View-Only) (Signed)
Russell Carney 08/25/2015 10:00 AM Location: [facility] Patient #: [id number] DOB: [birth year] Married / Language: Russell Carney / Race: White Male  History of Present Illness Russell Hollingshead MD; 08/25/2015 10:33 AM) The patient is a 60 year old male.   Note:He is referred by Dr. Laurence Carney for consultation regarding gallstone pancreatitis. He's had 2 episodes of acute pancreatitis recently. CT scan did not demonstrate any gallbladder disease. The first episode was felt to be secondary to Hydrochlorothiazide. He subsequently underwent an ultrasound which demonstrated multiple gallstones. He is here to discuss cholecystectomy. No family history of pancreatitis.  Other Problems Russell Carney, CMA; 08/25/2015 10:00 AM) Cholelithiasis Hepatitis High blood pressure Hypercholesterolemia Pancreatitis Sleep Apnea  Past Surgical History Russell Carney, CMA; 08/25/2015 10:00 AM) Colon Polyp Removal - Colonoscopy Knee Surgery Bilateral. Oral Surgery Spinal Surgery - Neck Tonsillectomy Vasectomy  Diagnostic Studies History Russell Carney, CMA; 08/25/2015 10:00 AM) Colonoscopy 5-10 years ago  Allergies Russell Carney, CMA; 08/25/2015 10:01 AM) No Known Drug Allergies 08/25/2015  Medication History (Russell Carney, CMA; 08/25/2015 10:02 AM) Zolpidem Tartrate (10MG  Tablet, Oral) Active. Celecoxib (200MG  Capsule, Oral) Active. Losartan Potassium (25MG  Tablet, Oral) Active. Creon (12000UNIT Capsule DR Part, Oral) Active. Probiotic & Acidophilus Ex St (Oral) Active. Aspirin (81MG  Tablet Chewable, Oral) Active. Vitamin D (Cholecalciferol) (1000UNIT Capsule, Oral) Active. Folate-B12-Intrinsic Factor (800-500-20MCG-MCG-MG Tablet, Oral) Active. Medications Reconciled  Social History Russell Carney, CMA; 08/25/2015 10:00 AM) Alcohol use Occasional alcohol use. Caffeine use Carbonated beverages, Coffee, Tea. No drug use Tobacco use Never smoker.  Family History (Russell Carney; 08/25/2015 10:00 AM) Cervical Cancer Sister. Colon Cancer Father. Colon Polyps Brother, Father. Diabetes Mellitus Father. Heart Disease Father, Mother. Hypertension Sister. Thyroid problems Mother.     Review of Systems (Russell Carney; 08/25/2015 10:00 AM) General Not Present- Appetite Loss, Chills, Fatigue, Fever, Night Sweats, Weight Gain and Weight Loss. Skin Not Present- Change in Wart/Mole, Dryness, Hives, Jaundice, New Lesions, Non-Healing Wounds, Rash and Ulcer. HEENT Not Present- Earache, Hearing Loss, Hoarseness, Nose Bleed, Oral Ulcers, Ringing in the Ears, Seasonal Allergies, Sinus Pain, Sore Throat, Visual Disturbances, Wears glasses/contact lenses and Yellow Eyes. Respiratory Not Present- Bloody sputum, Chronic Cough, Difficulty Breathing, Snoring and Wheezing. Breast Not Present- Breast Mass, Breast Pain, Nipple Discharge and Skin Changes. Cardiovascular Not Present- Chest Pain, Difficulty Breathing Lying Down, Leg Cramps, Palpitations, Rapid Heart Rate, Shortness of Breath and Swelling of Extremities. Gastrointestinal Not Present- Abdominal Pain, Bloating, Bloody Stool, Change in Bowel Habits, Chronic diarrhea, Constipation, Difficulty Swallowing, Excessive gas, Gets full quickly at meals, Hemorrhoids, Indigestion, Nausea, Rectal Pain and Vomiting. Male Genitourinary Not Present- Blood in Urine, Change in Urinary Stream, Frequency, Impotence, Nocturia, Painful Urination, Urgency and Urine Leakage. Musculoskeletal Not Present- Back Pain, Joint Pain, Joint Stiffness, Muscle Pain, Muscle Weakness and Swelling of Extremities. Neurological Not Present- Decreased Memory, Fainting, Headaches, Numbness, Seizures, Tingling, Tremor, Trouble walking and Weakness. Psychiatric Not Present- Anxiety, Bipolar, Change in Sleep Pattern, Depression, Fearful and Frequent crying. Endocrine Not Present- Cold Intolerance, Excessive Hunger, Hair Changes, Heat Intolerance, Hot flashes  and New Diabetes. Hematology Not Present- Blood Thinners, Easy Bruising, Excessive bleeding, Gland problems, HIV and Persistent Infections.  Vitals (Russell Carney CMA; 08/25/2015 10:01 AM) 08/25/2015 10:00 AM Weight: 217 lb Height: 69in Body Surface Area: 2.14 m Body Mass Index: 32.04 kg/m  Temp.: 99.29F(Temporal)  Pulse: 78 (Regular)  BP: 126/76 (Sitting, Left Arm, Standard)      Physical Exam Russell Hollingshead MD; 08/25/2015 10:35 AM)  The physical exam  findings are as follows: Note:General: Obese male in NAD. Pleasant and cooperative.  HEENT: [place]/AT, no external nasal or ear masses, mucous membranes are moist  EYES: no scleral icterus, pupils normal  NECK: Supple, scar present  CV: RRR, no murmur, no edema  CHEST: Breath sounds equal and clear. Respirations nonlabored.  ABDOMEN: Soft, nontender, nondistended, no masses, no organomegaly, active bowel sounds, no scars, no hernias.  MUSCULOSKELETAL: FROM, good muscle tone, normal station and gait  LYMPHATIC: No palpable cervical adenopathy  SKIN: No jaundice.  NEUROLOGIC: Alert and oriented, answers questions appropriately, normal gait and station.  PSYCHIATRIC: Normal mood, affect , and behavior.    Assessment & Plan Russell Hollingshead MD; 08/25/2015 10:35 AM)  Russell Carney PANCREATITIS (K85.10) Impression: Currently symptomatically. I recommended cholecystectomy and he is in agreement with this.  Plan: Laparoscopic cholecystectomy with cholangiogram. Low-fat diet. I have explained the procedure, risks, and aftercare of cholecystectomy. Risks include but are not limited to bleeding, infection, wound problems, anesthesia, diarrhea, bile leak, injury to common bile duct/liver/intestine. He seems to understand and agrees to proceed.  Russell Confer, MD

## 2015-09-19 NOTE — Op Note (Signed)
OPERATIVE NOTE-LAPAROSCOPIC CHOLECYSTECTOMY  Preoperative diagnosis:  Gallstone pancreatitis   Postoperative diagnosis:  Same  Procedure: Laparoscopic cholecystectomy with cholangiogram.  Surgeon: [name], M.D.  Asst.:  Fanny Skates, M.D.  Anesthesia: General  Indication:   This is a 60 year old male with two episodes of pancreatitis who has gallstones.  He now presents for cholecystectomy.  Technique: He was brought to the operating room, placed supine on the operating table, and a general anesthetic was administered. The hair on the abdominal wall was clipped as was necessary. The abdominal wall was then sterilely prepped and draped.  A timeout was performed.    Local anesthetic (Marcaine) was infiltrated in the subumbilical region. A small subumbilical incision was made through the skin, subcutaneous tissue, fascia, and peritoneum entering the peritoneal cavity under direct vision. A pursestring suture of 0 Vicryl was placed around the edges of the fascia. A Hassan trocar was introduced into the peritoneal cavity and a pneumoperitoneum was created by insufflation of carbon dioxide gas. The laparoscope was introduced into the trocar and no underlying bleeding or organ injury was noted. He was then placed in the reverse Trendelenburg position with the right side tilted slightly up.  Three 5 mm trocars were then placed into the abdominal cavity under laparoscopic vision. One in the epigastric area, and 2 in the right upper quadrant area. The gallbladder was visualized and the fundus was grasped and retracted toward the right shoulder.  The infundibulum was mobilized with dissection close to the gallbladder and retracted laterally. The cystic duct was identified and a window was created around it. The an anterior branch of the cystic artery was also identified and a window was created around it. The critical view was achieved. A clip was placed at the neck of the gallbladder. A small  incision was made in the cystic duct. A cholangiocatheter was introduced through the anterior abdominal wall and placed in the cystic duct. A intraoperative cholangiogram was then performed.  Under real-time fluoroscopy, dilute contrast was injected into the cystic duct.  The common hepatic duct, the right and left hepatic ducts, and the common duct were all visualized. Contrast drained into the duodenum without obvious evidence of any obstructing ductal lesion. The final report is pending the Radiologist's interpretation.  The cholangiocatheter was removed, the cystic duct was clipped 3 times on the biliary side, and then the cystic duct was divided sharply. No bile leak was noted from the cystic duct stump.  The anterior branch of the cystic artery was then clipped and divided.  The posterior branch of the cystic artery was isolated, clipped and divided.  Following this the gallbladder was dissected free from the liver using electrocautery.  There was some leakage of bile from the gallbladder but no stones leaked out.  The gallbladder was then placed in a retrieval bag and removed from the abdominal cavity through the subumbilical incision.  The gallbladder fossa was inspected, irrigated, and bleeding was controlled with electrocautery. Inspection showed that hemostasis was adequate and there was no evidence of bile leak. Surgicel was placed in the gallbladder fossa.  The irrigation fluid was evacuated as much as possible.  The subumbilical trocar was removed and the fascial defect was closed by tightening and tying down the pursestring suture under laparoscopic vision.  The remaining trocars were removed and the pneumoperitoneum was released. The skin incisions were closed with 4-0 Monocryl subcuticular stitches. Steri-Strips and sterile dressings were applied.  The procedure was well-tolerated without any apparent complications.  He was taken to the recovery room in satisfactory condition.

## 2015-09-19 NOTE — Anesthesia Procedure Notes (Signed)
Procedure Name: Intubation Date/Time: 09/19/2015 7:40 AM Performed by: Huey Romans ANN Pre-anesthesia Checklist: Patient identified, Emergency Drugs available, Suction available and Patient being monitored Patient Re-evaluated:Patient Re-evaluated prior to inductionOxygen Delivery Method: Circle System Utilized Preoxygenation: Pre-oxygenation with 100% oxygen Intubation Type: IV induction Ventilation: Mask ventilation without difficulty and Oral airway inserted - appropriate to patient size Laryngoscope Size: Glidescope Tube type: Oral Tube size: 7.5 mm Number of attempts: 1 Airway Equipment and Method: Stylet and Oral airway Placement Confirmation: ETT inserted through vocal cords under direct vision,  positive ETCO2 and breath sounds checked- equal and bilateral Secured at: 23 cm Tube secured with: Tape Dental Injury: Teeth and Oropharynx as per pre-operative assessment  Difficulty Due To: Difficulty was anticipated, Difficult Airway- due to limited oral opening and Difficult Airway- due to reduced neck mobility Future Recommendations: Recommend- induction with short-acting agent, and alternative techniques readily available Comments: MV improved with muscle relaxation.  Previous cervical fusion, limited mobility.  Small TM distance.

## 2015-09-21 ENCOUNTER — Encounter (HOSPITAL_COMMUNITY): Payer: Self-pay | Admitting: General Surgery

## 2015-11-10 IMAGING — DX DG ABDOMEN ACUTE W/ 1V CHEST
4 series · 4 of 4 positions shown · non-contrast
Comparison: Chest 03/28/2014

CLINICAL DATA: Sudden onset severe nonspecific abdominal pain since
2444 hours tonight.

EXAM:
DG ABDOMEN ACUTE W/ 1V CHEST

[chest pa]
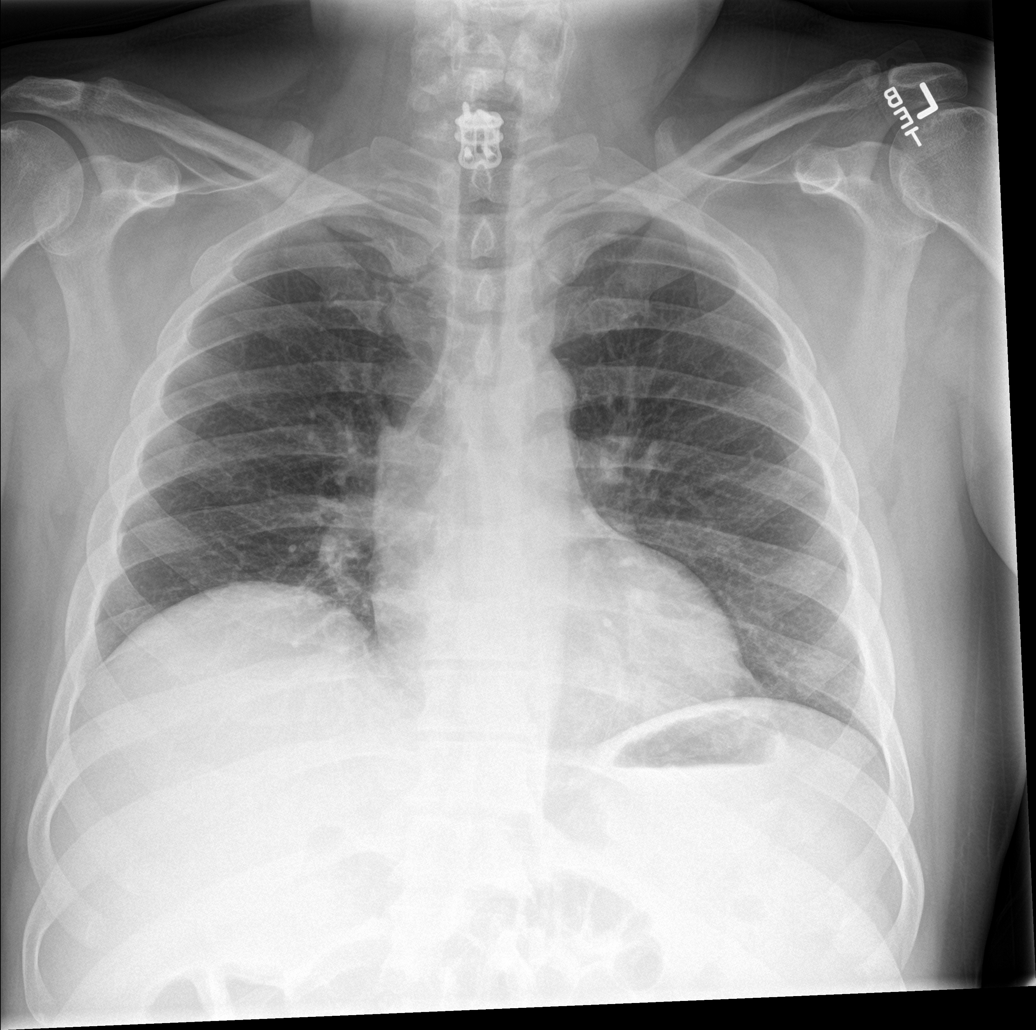

[abdomen erect]
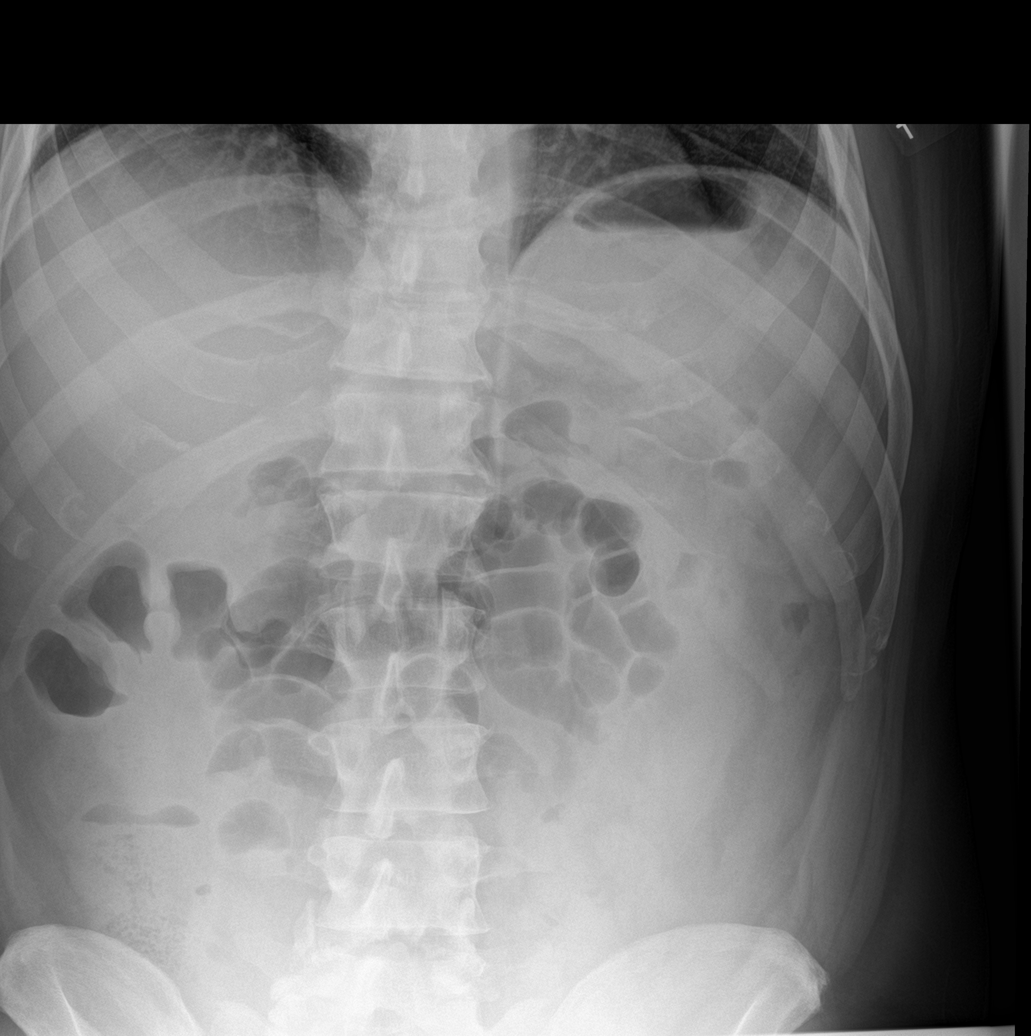

[abdomen supine (1 of 2)]
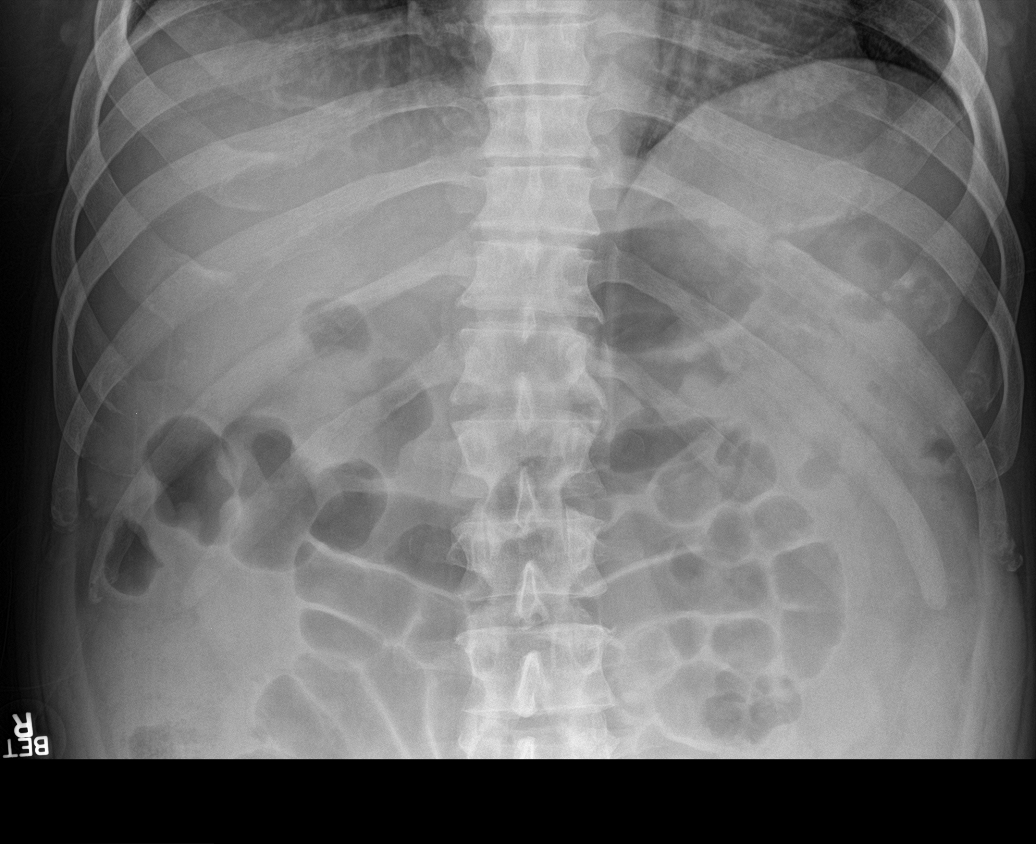

[abdomen supine (2 of 2)]
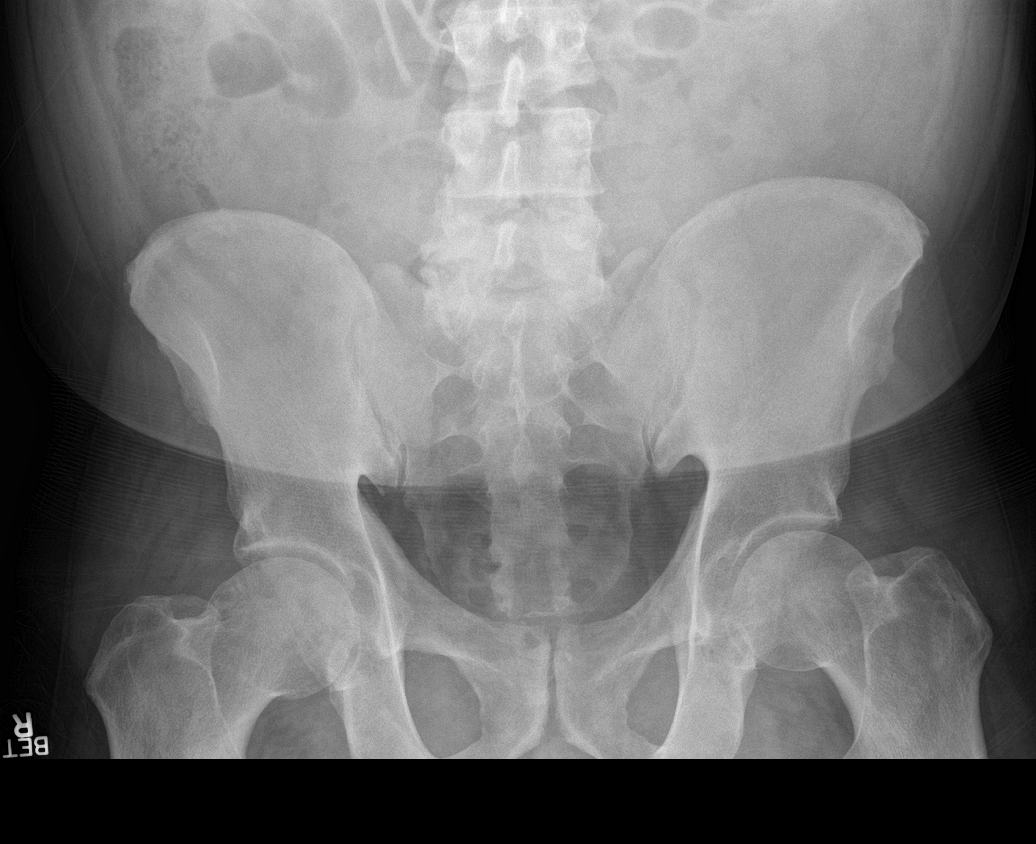

[4 of 4 positions shown; findings below may reference images not displayed]

FINDINGS: Normal heart size and pulmonary vascularity. No focal airspace
disease or consolidation in the lungs. No blunting of costophrenic
angles. No pneumothorax. Mediastinal contours appear intact.

Gas-filled mid abdominal small bowel without distention. Scattered
gas and stool in the colon. No small or large bowel distention.
Changes may indicate ileus or enteritis. No free air. No abnormal
air-fluid levels. No radiopaque stones. Visualized bones appear
intact.
IMPRESSION: No evidence of active pulmonary disease. Gas-filled nondilated
central small bowel loops may indicate ileus or enteritis.

## 2015-11-10 IMAGING — CT CT ABD-PELV W/ CM
2 of 5 series · 15 of 46 positions shown, 17 images · IV contrast (Omnipaque 300)
Comparison: None.

CLINICAL DATA: Constant generalized abdominal pain, onset tonight
around 8 p.m.. Diarrhea for 5 days. Nausea. Vomiting.

EXAM:
CT ABDOMEN AND PELVIS WITH CONTRAST
TECHNIQUE: Multidetector CT imaging of the abdomen and pelvis was performed
using the standard protocol following bolus administration of
intravenous contrast.
CONTRAST:  50mL OMNIPAQUE IOHEXOL 300 MG/ML SOLN, 100mL OMNIPAQUE
IOHEXOL 300 MG/ML SOLN

[Series 2: abd_pel_with 5.0 b40f · axial · 0.93mm/px · z∈[-518,-18]mm · 12 of 114 slices shown, 14 images]
[im 7/114  soft-tissue]
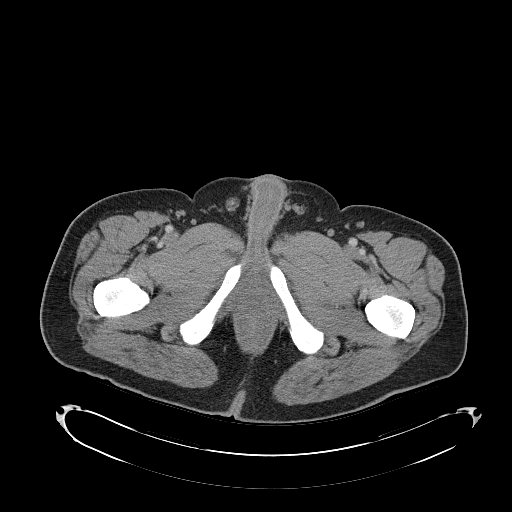
[im 7/114  bone]
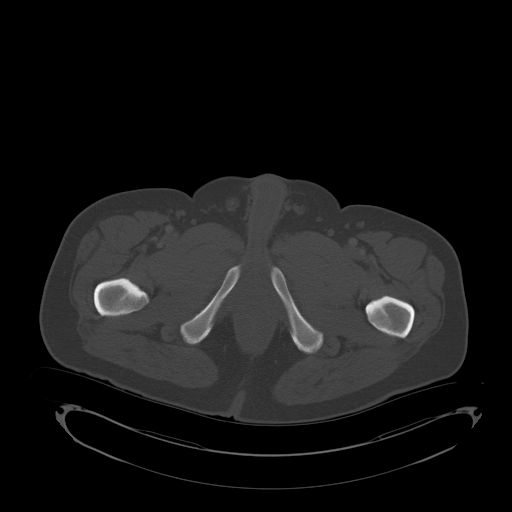
[im 19/114  soft-tissue]
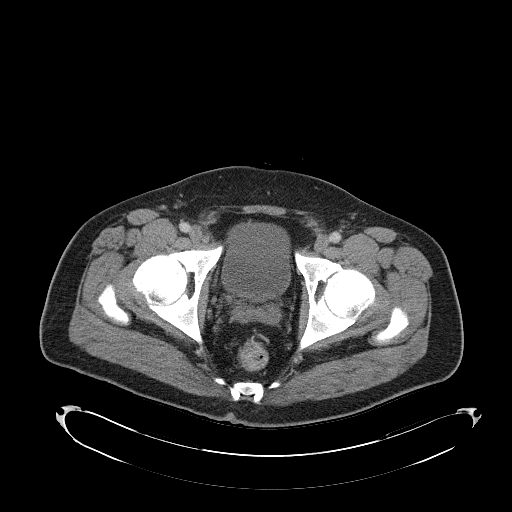
[im 26/114  soft-tissue]
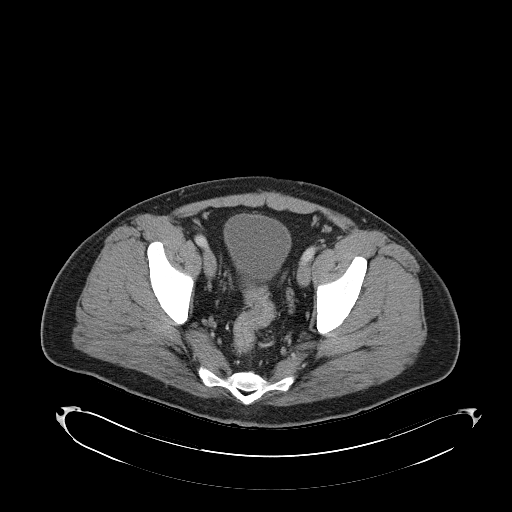
[im 32/114  soft-tissue]
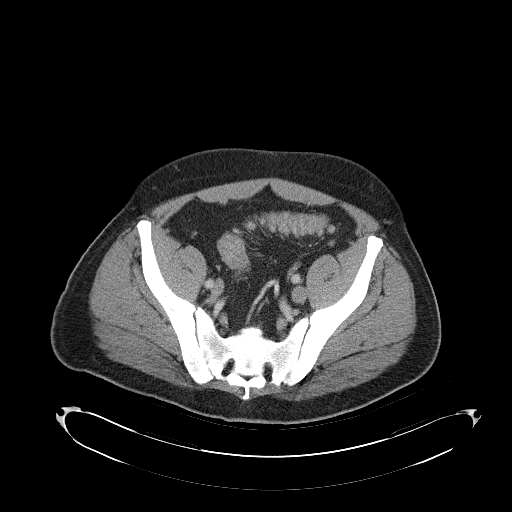
[im 44/114  soft-tissue]
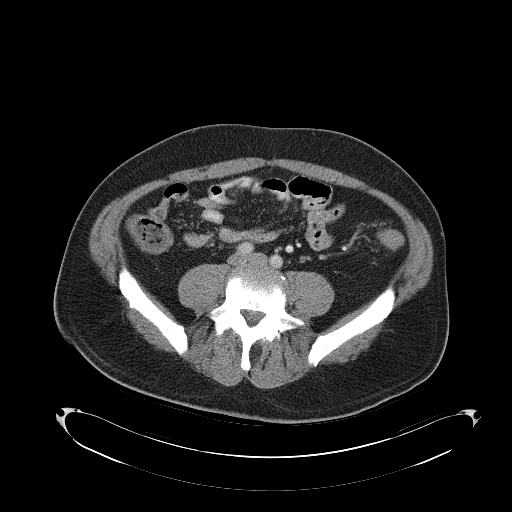
[im 51/114  soft-tissue]
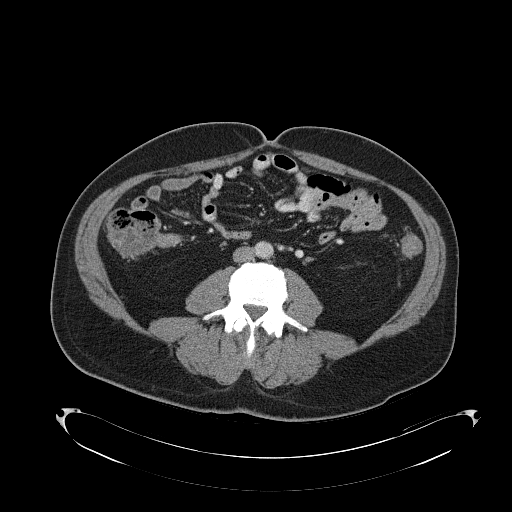
[im 63/114  soft-tissue]
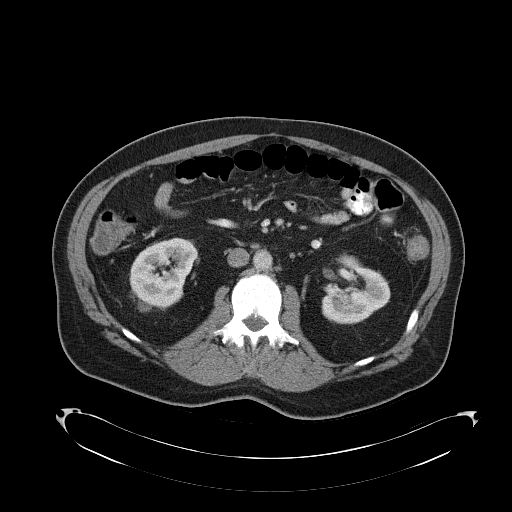
[im 70/114  soft-tissue]
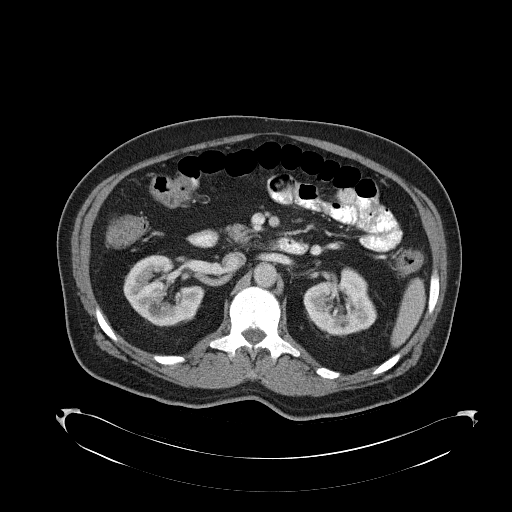
[im 82/114  soft-tissue]
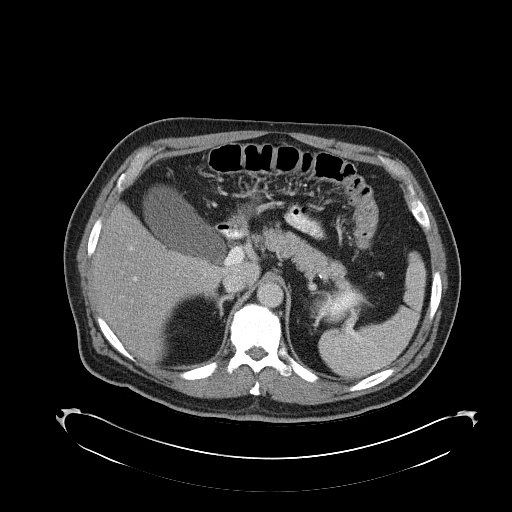
[im 82/114  bone]
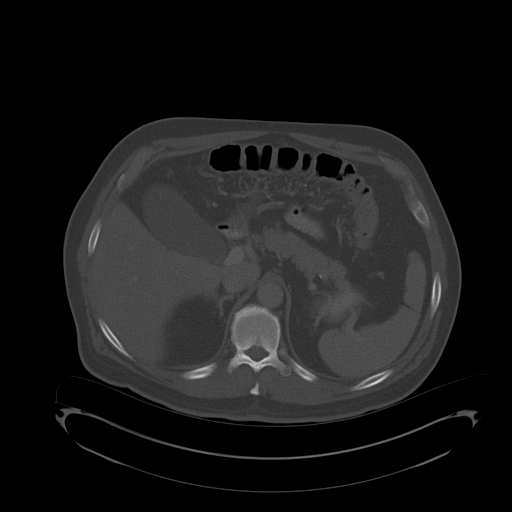
[im 88/114  soft-tissue]
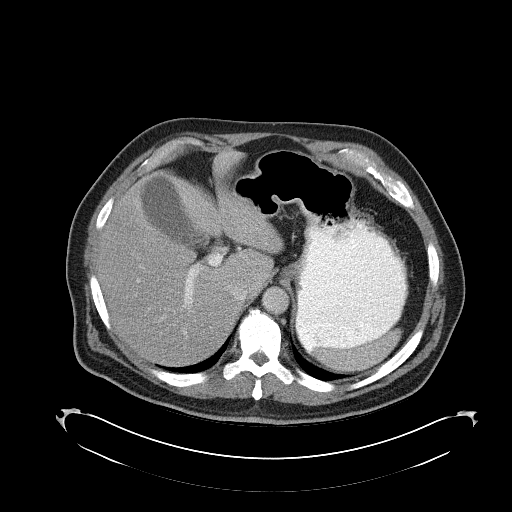
[im 95/114  soft-tissue]
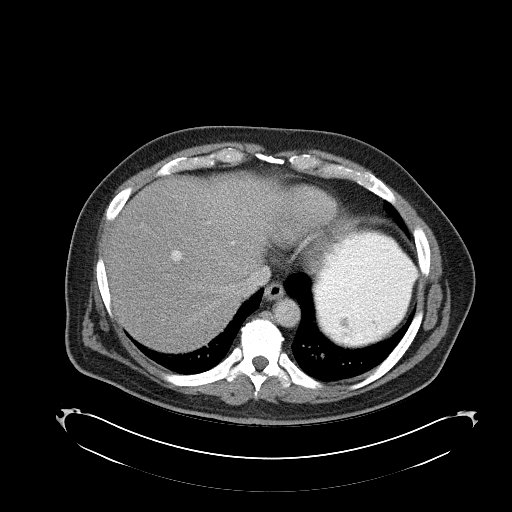
[im 107/114  soft-tissue]
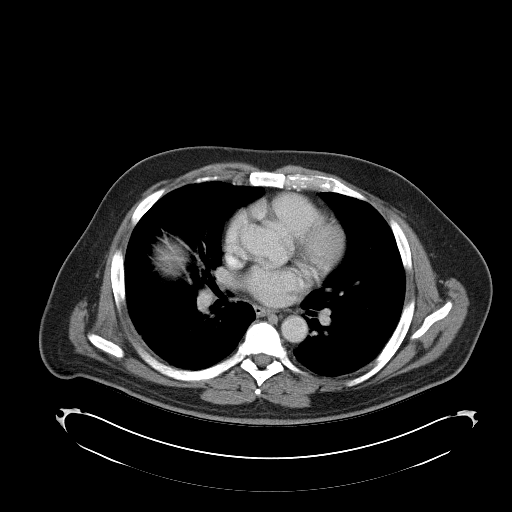

[Series 3: abd_pel_with 3.0 spo cor · coronal · 0.75mm/px · 3 of 99 slices shown]
[im 33/99  soft-tissue]
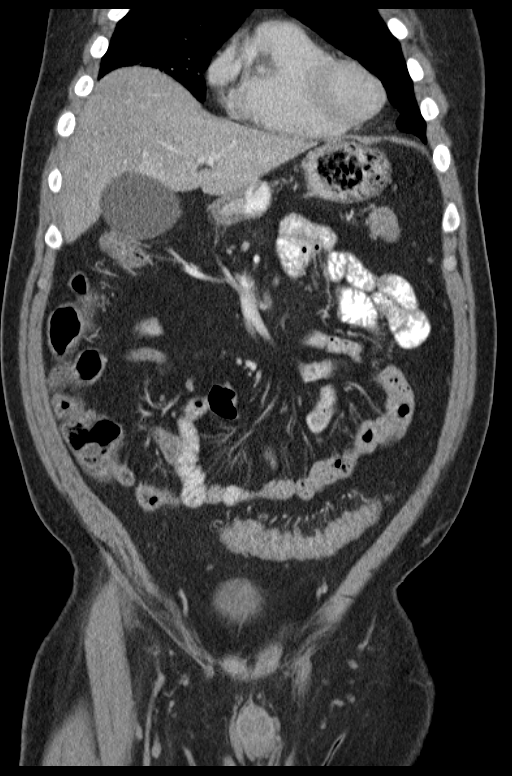
[im 44/99  soft-tissue]
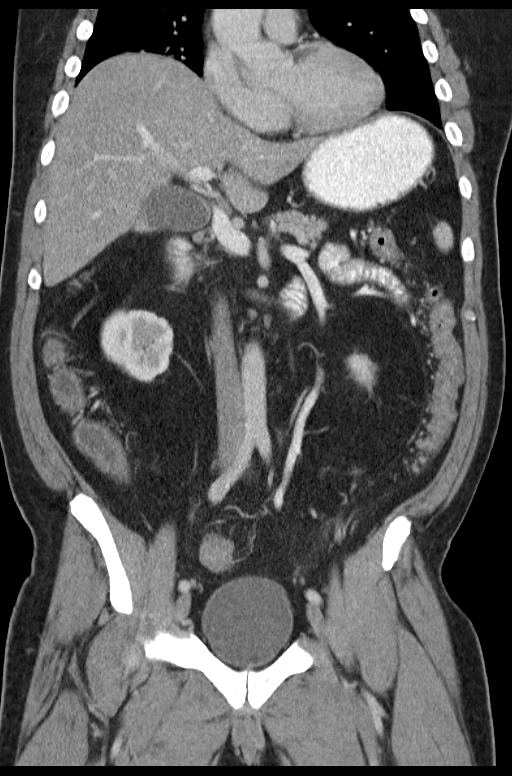
[im 55/99  soft-tissue]
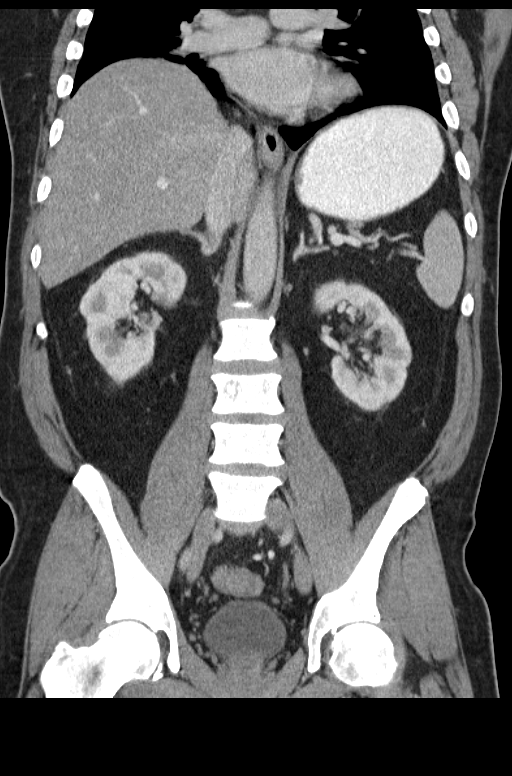

[15 of 46 positions shown; findings below may reference images not displayed]

FINDINGS: Dependent atelectasis in the lung bases.

Mild diffuse fatty infiltration in the liver. No focal liver
lesions. Gallbladder is mildly distended. No stones or wall
thickening appreciated. No bile duct dilatation. Pancreas, spleen,
adrenal glands, abdominal aorta, inferior vena cava, and
retroperitoneal lymph nodes are unremarkable. Parapelvic cysts in
the left kidney. Sub cm parenchymal cysts in the right kidney. No
hydronephrosis or solid mass in the kidneys. Stomach, small bowel,
and colon are not abnormally distended. No wall thickening is
appreciated. No free air or free fluid in the abdomen. Abdominal
wall musculature appears intact.

Pelvis: Appendix is normal. Scattered diverticula in the sigmoid
colon without evidence of diverticulitis. Prostate gland is not
enlarged. Bladder wall is not thickened. No free or loculated pelvic
fluid collections. No pelvic mass or lymphadenopathy. Degenerative
changes in the spine. No destructive bone lesions.
IMPRESSION: No acute process demonstrated in the abdomen or pelvis. No evidence
of bowel obstruction or inflammation. Fatty infiltration of the
liver with mild distention of the gallbladder.

## 2016-01-01 ENCOUNTER — Emergency Department (HOSPITAL_COMMUNITY): Payer: Commercial Managed Care - HMO

## 2016-01-01 ENCOUNTER — Encounter (HOSPITAL_COMMUNITY): Payer: Self-pay

## 2016-01-01 ENCOUNTER — Emergency Department (HOSPITAL_COMMUNITY)
Admission: EM | Admit: 2016-01-01 | Discharge: 2016-01-01 | Disposition: A | Discharge status: Home / Self Care | Payer: Commercial Managed Care - HMO | Attending: Emergency Medicine | Admitting: Emergency Medicine

## 2016-01-01 DIAGNOSIS — S161XXA Strain of muscle, fascia and tendon at neck level, initial encounter: Secondary | ICD-10-CM | POA: Diagnosis not present

## 2016-01-01 DIAGNOSIS — I10 Essential (primary) hypertension: Secondary | ICD-10-CM | POA: Diagnosis not present

## 2016-01-01 DIAGNOSIS — Z7982 Long term (current) use of aspirin: Secondary | ICD-10-CM | POA: Diagnosis not present

## 2016-01-01 DIAGNOSIS — Z79899 Other long term (current) drug therapy: Secondary | ICD-10-CM | POA: Insufficient documentation

## 2016-01-01 DIAGNOSIS — Y92410 Unspecified street and highway as the place of occurrence of the external cause: Secondary | ICD-10-CM | POA: Insufficient documentation

## 2016-01-01 DIAGNOSIS — Y999 Unspecified external cause status: Secondary | ICD-10-CM | POA: Insufficient documentation

## 2016-01-01 DIAGNOSIS — Y9389 Activity, other specified: Secondary | ICD-10-CM | POA: Insufficient documentation

## 2016-01-01 DIAGNOSIS — S199XXA Unspecified injury of neck, initial encounter: Secondary | ICD-10-CM | POA: Diagnosis present

## 2016-01-01 MED ORDER — IBUPROFEN 800 MG PO TABS: 800.0000 mg | ORAL_TABLET | Freq: Three times a day (TID) | ORAL | 0 refills | Status: DC

## 2016-01-01 MED ORDER — IBUPROFEN 800 MG PO TABS: 800.0000 mg | ORAL_TABLET | Freq: Once | ORAL | Status: DC

## 2016-01-01 MED ORDER — IBUPROFEN 800 MG PO TABS
ORAL_TABLET | ORAL | Status: AC
  Filled 2016-01-01: qty 1

## 2016-01-01 MED ORDER — IBUPROFEN 800 MG PO TABS
800.0000 mg | ORAL_TABLET | Freq: Once | ORAL | Status: AC
  Administered 2016-01-01: 800 mg via ORAL
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
## 2016-01-01 NOTE — Discharge Instructions (Signed)
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
## 2016-01-26 DIAGNOSIS — H04123 Dry eye syndrome of bilateral lacrimal glands: Secondary | ICD-10-CM | POA: Diagnosis not present

## 2016-01-26 DIAGNOSIS — H2513 Age-related nuclear cataract, bilateral: Secondary | ICD-10-CM | POA: Diagnosis not present

## 2016-05-11 DIAGNOSIS — M1712 Unilateral primary osteoarthritis, left knee: Secondary | ICD-10-CM | POA: Diagnosis not present

## 2016-06-09 DIAGNOSIS — G4733 Obstructive sleep apnea (adult) (pediatric): Secondary | ICD-10-CM | POA: Diagnosis not present

## 2016-06-28 DIAGNOSIS — M1712 Unilateral primary osteoarthritis, left knee: Secondary | ICD-10-CM | POA: Diagnosis not present

## 2016-07-02 DIAGNOSIS — G4700 Insomnia, unspecified: Secondary | ICD-10-CM | POA: Diagnosis not present

## 2016-07-02 DIAGNOSIS — Z23 Encounter for immunization: Secondary | ICD-10-CM | POA: Diagnosis not present

## 2016-07-02 DIAGNOSIS — Z0000 Encounter for general adult medical examination without abnormal findings: Secondary | ICD-10-CM | POA: Diagnosis not present

## 2016-07-05 DIAGNOSIS — M1712 Unilateral primary osteoarthritis, left knee: Secondary | ICD-10-CM | POA: Diagnosis not present

## 2016-07-12 DIAGNOSIS — M1712 Unilateral primary osteoarthritis, left knee: Secondary | ICD-10-CM | POA: Diagnosis not present

## 2016-08-24 DIAGNOSIS — M1712 Unilateral primary osteoarthritis, left knee: Secondary | ICD-10-CM | POA: Diagnosis not present

## 2016-09-14 DIAGNOSIS — G4733 Obstructive sleep apnea (adult) (pediatric): Secondary | ICD-10-CM | POA: Diagnosis not present

## 2016-10-25 DIAGNOSIS — R740 Nonspecific elevation of levels of transaminase and lactic acid dehydrogenase [LDH]: Secondary | ICD-10-CM | POA: Diagnosis not present

## 2016-11-12 DIAGNOSIS — M25511 Pain in right shoulder: Secondary | ICD-10-CM | POA: Diagnosis not present

## 2016-11-12 DIAGNOSIS — I10 Essential (primary) hypertension: Secondary | ICD-10-CM | POA: Diagnosis not present

## 2016-11-18 DIAGNOSIS — G4733 Obstructive sleep apnea (adult) (pediatric): Secondary | ICD-10-CM | POA: Diagnosis not present

## 2016-12-24 DIAGNOSIS — G4733 Obstructive sleep apnea (adult) (pediatric): Secondary | ICD-10-CM | POA: Diagnosis not present

## 2017-01-13 DIAGNOSIS — Z23 Encounter for immunization: Secondary | ICD-10-CM | POA: Diagnosis not present

## 2017-01-27 DIAGNOSIS — H04123 Dry eye syndrome of bilateral lacrimal glands: Secondary | ICD-10-CM | POA: Diagnosis not present

## 2017-01-27 DIAGNOSIS — H2513 Age-related nuclear cataract, bilateral: Secondary | ICD-10-CM | POA: Diagnosis not present

## 2017-06-15 DIAGNOSIS — G4733 Obstructive sleep apnea (adult) (pediatric): Secondary | ICD-10-CM | POA: Diagnosis not present

## 2017-08-25 DIAGNOSIS — E7800 Pure hypercholesterolemia, unspecified: Secondary | ICD-10-CM | POA: Diagnosis not present

## 2017-08-25 DIAGNOSIS — Z79899 Other long term (current) drug therapy: Secondary | ICD-10-CM | POA: Diagnosis not present

## 2017-08-25 DIAGNOSIS — I10 Essential (primary) hypertension: Secondary | ICD-10-CM | POA: Diagnosis not present

## 2017-08-25 DIAGNOSIS — G4700 Insomnia, unspecified: Secondary | ICD-10-CM | POA: Diagnosis not present

## 2017-09-28 DIAGNOSIS — G4733 Obstructive sleep apnea (adult) (pediatric): Secondary | ICD-10-CM | POA: Diagnosis not present

## 2017-10-28 DIAGNOSIS — Z23 Encounter for immunization: Secondary | ICD-10-CM | POA: Diagnosis not present

## 2017-12-21 DIAGNOSIS — G4733 Obstructive sleep apnea (adult) (pediatric): Secondary | ICD-10-CM | POA: Diagnosis not present

## 2018-01-05 DIAGNOSIS — G4733 Obstructive sleep apnea (adult) (pediatric): Secondary | ICD-10-CM | POA: Diagnosis not present

## 2018-03-08 DIAGNOSIS — Z23 Encounter for immunization: Secondary | ICD-10-CM | POA: Diagnosis not present

## 2018-03-08 DIAGNOSIS — E7800 Pure hypercholesterolemia, unspecified: Secondary | ICD-10-CM | POA: Diagnosis not present

## 2018-03-08 DIAGNOSIS — Z0000 Encounter for general adult medical examination without abnormal findings: Secondary | ICD-10-CM | POA: Diagnosis not present

## 2018-03-08 DIAGNOSIS — Z125 Encounter for screening for malignant neoplasm of prostate: Secondary | ICD-10-CM | POA: Diagnosis not present

## 2018-04-15 DIAGNOSIS — G4733 Obstructive sleep apnea (adult) (pediatric): Secondary | ICD-10-CM | POA: Diagnosis not present

## 2019-02-06 ENCOUNTER — Ambulatory Visit
Admission: RE | Admit: 2019-02-06 | Discharge: 2019-02-06 | Disposition: A | Discharge status: Home / Self Care | Payer: 59 | Source: Ambulatory Visit | Attending: Family Medicine | Admitting: Family Medicine

## 2019-02-06 ENCOUNTER — Other Ambulatory Visit: Payer: Self-pay

## 2019-02-06 ENCOUNTER — Other Ambulatory Visit: Payer: Self-pay | Admitting: Family Medicine

## 2019-02-06 DIAGNOSIS — Z01818 Encounter for other preprocedural examination: Secondary | ICD-10-CM

## 2020-03-19 DIAGNOSIS — Z96652 Presence of left artificial knee joint: Secondary | ICD-10-CM | POA: Diagnosis not present

## 2020-03-31 DIAGNOSIS — Z23 Encounter for immunization: Secondary | ICD-10-CM | POA: Diagnosis not present

## 2020-03-31 DIAGNOSIS — M1712 Unilateral primary osteoarthritis, left knee: Secondary | ICD-10-CM | POA: Diagnosis not present

## 2020-03-31 DIAGNOSIS — R202 Paresthesia of skin: Secondary | ICD-10-CM | POA: Diagnosis not present

## 2020-03-31 DIAGNOSIS — I10 Essential (primary) hypertension: Secondary | ICD-10-CM | POA: Diagnosis not present

## 2020-03-31 DIAGNOSIS — Z0000 Encounter for general adult medical examination without abnormal findings: Secondary | ICD-10-CM | POA: Diagnosis not present

## 2020-03-31 DIAGNOSIS — G4700 Insomnia, unspecified: Secondary | ICD-10-CM | POA: Diagnosis not present

## 2020-03-31 DIAGNOSIS — E559 Vitamin D deficiency, unspecified: Secondary | ICD-10-CM | POA: Diagnosis not present

## 2020-03-31 DIAGNOSIS — Z79899 Other long term (current) drug therapy: Secondary | ICD-10-CM | POA: Diagnosis not present

## 2020-03-31 DIAGNOSIS — E7800 Pure hypercholesterolemia, unspecified: Secondary | ICD-10-CM | POA: Diagnosis not present

## 2020-04-18 DIAGNOSIS — S338XXA Sprain of other parts of lumbar spine and pelvis, initial encounter: Secondary | ICD-10-CM | POA: Diagnosis not present

## 2020-04-18 DIAGNOSIS — M9902 Segmental and somatic dysfunction of thoracic region: Secondary | ICD-10-CM | POA: Diagnosis not present

## 2020-04-18 DIAGNOSIS — S134XXA Sprain of ligaments of cervical spine, initial encounter: Secondary | ICD-10-CM | POA: Diagnosis not present

## 2020-04-18 DIAGNOSIS — M9901 Segmental and somatic dysfunction of cervical region: Secondary | ICD-10-CM | POA: Diagnosis not present

## 2020-04-18 DIAGNOSIS — S233XXA Sprain of ligaments of thoracic spine, initial encounter: Secondary | ICD-10-CM | POA: Diagnosis not present

## 2020-04-18 DIAGNOSIS — M9903 Segmental and somatic dysfunction of lumbar region: Secondary | ICD-10-CM | POA: Diagnosis not present

## 2020-04-25 DIAGNOSIS — M9902 Segmental and somatic dysfunction of thoracic region: Secondary | ICD-10-CM | POA: Diagnosis not present

## 2020-04-25 DIAGNOSIS — S134XXA Sprain of ligaments of cervical spine, initial encounter: Secondary | ICD-10-CM | POA: Diagnosis not present

## 2020-04-25 DIAGNOSIS — S338XXA Sprain of other parts of lumbar spine and pelvis, initial encounter: Secondary | ICD-10-CM | POA: Diagnosis not present

## 2020-04-25 DIAGNOSIS — S233XXA Sprain of ligaments of thoracic spine, initial encounter: Secondary | ICD-10-CM | POA: Diagnosis not present

## 2020-04-25 DIAGNOSIS — M9901 Segmental and somatic dysfunction of cervical region: Secondary | ICD-10-CM | POA: Diagnosis not present

## 2020-04-25 DIAGNOSIS — M9903 Segmental and somatic dysfunction of lumbar region: Secondary | ICD-10-CM | POA: Diagnosis not present

## 2020-05-06 DIAGNOSIS — M9903 Segmental and somatic dysfunction of lumbar region: Secondary | ICD-10-CM | POA: Diagnosis not present

## 2020-05-06 DIAGNOSIS — S233XXA Sprain of ligaments of thoracic spine, initial encounter: Secondary | ICD-10-CM | POA: Diagnosis not present

## 2020-05-06 DIAGNOSIS — S338XXA Sprain of other parts of lumbar spine and pelvis, initial encounter: Secondary | ICD-10-CM | POA: Diagnosis not present

## 2020-05-06 DIAGNOSIS — M9902 Segmental and somatic dysfunction of thoracic region: Secondary | ICD-10-CM | POA: Diagnosis not present

## 2020-05-06 DIAGNOSIS — S134XXA Sprain of ligaments of cervical spine, initial encounter: Secondary | ICD-10-CM | POA: Diagnosis not present

## 2020-05-06 DIAGNOSIS — M9901 Segmental and somatic dysfunction of cervical region: Secondary | ICD-10-CM | POA: Diagnosis not present

## 2020-11-12 DIAGNOSIS — M79641 Pain in right hand: Secondary | ICD-10-CM | POA: Diagnosis not present

## 2020-12-03 DIAGNOSIS — H2513 Age-related nuclear cataract, bilateral: Secondary | ICD-10-CM | POA: Diagnosis not present

## 2020-12-03 DIAGNOSIS — H04123 Dry eye syndrome of bilateral lacrimal glands: Secondary | ICD-10-CM | POA: Diagnosis not present

## 2020-12-30 DIAGNOSIS — L821 Other seborrheic keratosis: Secondary | ICD-10-CM | POA: Diagnosis not present

## 2020-12-30 DIAGNOSIS — L570 Actinic keratosis: Secondary | ICD-10-CM | POA: Diagnosis not present

## 2020-12-30 DIAGNOSIS — C44319 Basal cell carcinoma of skin of other parts of face: Secondary | ICD-10-CM | POA: Diagnosis not present

## 2020-12-30 DIAGNOSIS — D0439 Carcinoma in situ of skin of other parts of face: Secondary | ICD-10-CM | POA: Diagnosis not present

## 2021-02-09 DIAGNOSIS — C44319 Basal cell carcinoma of skin of other parts of face: Secondary | ICD-10-CM | POA: Diagnosis not present

## 2021-05-04 DIAGNOSIS — Z23 Encounter for immunization: Secondary | ICD-10-CM | POA: Diagnosis not present

## 2021-05-04 DIAGNOSIS — E559 Vitamin D deficiency, unspecified: Secondary | ICD-10-CM | POA: Diagnosis not present

## 2021-05-04 DIAGNOSIS — E7800 Pure hypercholesterolemia, unspecified: Secondary | ICD-10-CM | POA: Diagnosis not present

## 2021-05-04 DIAGNOSIS — Z79899 Other long term (current) drug therapy: Secondary | ICD-10-CM | POA: Diagnosis not present

## 2021-05-04 DIAGNOSIS — R0602 Shortness of breath: Secondary | ICD-10-CM | POA: Diagnosis not present

## 2021-05-04 DIAGNOSIS — Z0001 Encounter for general adult medical examination with abnormal findings: Secondary | ICD-10-CM | POA: Diagnosis not present

## 2021-05-06 DIAGNOSIS — L570 Actinic keratosis: Secondary | ICD-10-CM | POA: Diagnosis not present

## 2021-05-06 DIAGNOSIS — Z85828 Personal history of other malignant neoplasm of skin: Secondary | ICD-10-CM | POA: Diagnosis not present

## 2021-07-14 DIAGNOSIS — Z79899 Other long term (current) drug therapy: Secondary | ICD-10-CM | POA: Diagnosis not present

## 2021-09-01 ENCOUNTER — Other Ambulatory Visit (HOSPITAL_COMMUNITY): Payer: Self-pay | Admitting: Family Medicine

## 2021-09-01 ENCOUNTER — Other Ambulatory Visit: Payer: Self-pay | Admitting: Family Medicine

## 2021-09-01 DIAGNOSIS — E7800 Pure hypercholesterolemia, unspecified: Secondary | ICD-10-CM

## 2021-09-29 DIAGNOSIS — Z86010 Personal history of colonic polyps: Secondary | ICD-10-CM | POA: Diagnosis not present

## 2021-09-29 DIAGNOSIS — D124 Benign neoplasm of descending colon: Secondary | ICD-10-CM | POA: Diagnosis not present

## 2021-09-29 DIAGNOSIS — D123 Benign neoplasm of transverse colon: Secondary | ICD-10-CM | POA: Diagnosis not present

## 2021-09-29 DIAGNOSIS — D125 Benign neoplasm of sigmoid colon: Secondary | ICD-10-CM | POA: Diagnosis not present

## 2021-09-29 DIAGNOSIS — Z09 Encounter for follow-up examination after completed treatment for conditions other than malignant neoplasm: Secondary | ICD-10-CM | POA: Diagnosis not present

## 2021-09-29 DIAGNOSIS — D128 Benign neoplasm of rectum: Secondary | ICD-10-CM | POA: Diagnosis not present

## 2021-09-29 DIAGNOSIS — K648 Other hemorrhoids: Secondary | ICD-10-CM | POA: Diagnosis not present

## 2021-09-29 DIAGNOSIS — K5730 Diverticulosis of large intestine without perforation or abscess without bleeding: Secondary | ICD-10-CM | POA: Diagnosis not present

## 2021-10-01 DIAGNOSIS — D128 Benign neoplasm of rectum: Secondary | ICD-10-CM | POA: Diagnosis not present

## 2021-10-01 DIAGNOSIS — D123 Benign neoplasm of transverse colon: Secondary | ICD-10-CM | POA: Diagnosis not present

## 2021-10-01 DIAGNOSIS — D125 Benign neoplasm of sigmoid colon: Secondary | ICD-10-CM | POA: Diagnosis not present

## 2021-10-01 DIAGNOSIS — D124 Benign neoplasm of descending colon: Secondary | ICD-10-CM | POA: Diagnosis not present

## 2021-10-07 ENCOUNTER — Ambulatory Visit (HOSPITAL_COMMUNITY)
Admission: RE | Admit: 2021-10-07 | Discharge: 2021-10-07 | Disposition: A | Discharge status: Home / Self Care | Payer: Medicare Other | Source: Ambulatory Visit | Attending: Family Medicine | Admitting: Family Medicine

## 2021-10-07 DIAGNOSIS — E7800 Pure hypercholesterolemia, unspecified: Secondary | ICD-10-CM

## 2021-10-13 DIAGNOSIS — K649 Unspecified hemorrhoids: Secondary | ICD-10-CM | POA: Diagnosis not present

## 2021-10-13 DIAGNOSIS — K621 Rectal polyp: Secondary | ICD-10-CM | POA: Diagnosis not present

## 2021-10-13 DIAGNOSIS — K6289 Other specified diseases of anus and rectum: Secondary | ICD-10-CM | POA: Diagnosis not present

## 2021-10-15 DIAGNOSIS — K621 Rectal polyp: Secondary | ICD-10-CM | POA: Diagnosis not present

## 2021-11-03 DIAGNOSIS — G4700 Insomnia, unspecified: Secondary | ICD-10-CM | POA: Diagnosis not present

## 2021-11-03 DIAGNOSIS — I10 Essential (primary) hypertension: Secondary | ICD-10-CM | POA: Diagnosis not present

## 2021-11-03 DIAGNOSIS — E7800 Pure hypercholesterolemia, unspecified: Secondary | ICD-10-CM | POA: Diagnosis not present

## 2021-11-03 DIAGNOSIS — Z23 Encounter for immunization: Secondary | ICD-10-CM | POA: Diagnosis not present

## 2021-12-02 DIAGNOSIS — R350 Frequency of micturition: Secondary | ICD-10-CM | POA: Diagnosis not present

## 2021-12-03 DIAGNOSIS — H25813 Combined forms of age-related cataract, bilateral: Secondary | ICD-10-CM | POA: Diagnosis not present

## 2022-02-23 DIAGNOSIS — B078 Other viral warts: Secondary | ICD-10-CM | POA: Diagnosis not present

## 2022-02-23 DIAGNOSIS — D2271 Melanocytic nevi of right lower limb, including hip: Secondary | ICD-10-CM | POA: Diagnosis not present

## 2022-02-23 DIAGNOSIS — D485 Neoplasm of uncertain behavior of skin: Secondary | ICD-10-CM | POA: Diagnosis not present

## 2022-02-23 DIAGNOSIS — L821 Other seborrheic keratosis: Secondary | ICD-10-CM | POA: Diagnosis not present

## 2022-02-23 DIAGNOSIS — D225 Melanocytic nevi of trunk: Secondary | ICD-10-CM | POA: Diagnosis not present

## 2022-02-23 DIAGNOSIS — L918 Other hypertrophic disorders of the skin: Secondary | ICD-10-CM | POA: Diagnosis not present

## 2022-02-23 DIAGNOSIS — D034 Melanoma in situ of scalp and neck: Secondary | ICD-10-CM | POA: Diagnosis not present

## 2022-02-23 DIAGNOSIS — L570 Actinic keratosis: Secondary | ICD-10-CM | POA: Diagnosis not present

## 2022-02-23 DIAGNOSIS — Z85828 Personal history of other malignant neoplasm of skin: Secondary | ICD-10-CM | POA: Diagnosis not present

## 2022-03-18 DIAGNOSIS — L905 Scar conditions and fibrosis of skin: Secondary | ICD-10-CM | POA: Diagnosis not present

## 2022-03-18 DIAGNOSIS — D034 Melanoma in situ of scalp and neck: Secondary | ICD-10-CM | POA: Diagnosis not present

## 2022-03-18 DIAGNOSIS — L989 Disorder of the skin and subcutaneous tissue, unspecified: Secondary | ICD-10-CM | POA: Diagnosis not present

## 2022-05-10 DIAGNOSIS — E559 Vitamin D deficiency, unspecified: Secondary | ICD-10-CM | POA: Diagnosis not present

## 2022-05-10 DIAGNOSIS — Z79899 Other long term (current) drug therapy: Secondary | ICD-10-CM | POA: Diagnosis not present

## 2022-05-10 DIAGNOSIS — Z0001 Encounter for general adult medical examination with abnormal findings: Secondary | ICD-10-CM | POA: Diagnosis not present

## 2022-05-10 DIAGNOSIS — I77810 Thoracic aortic ectasia: Secondary | ICD-10-CM | POA: Diagnosis not present

## 2022-05-10 DIAGNOSIS — E7800 Pure hypercholesterolemia, unspecified: Secondary | ICD-10-CM | POA: Diagnosis not present

## 2022-08-31 DIAGNOSIS — Z85820 Personal history of malignant melanoma of skin: Secondary | ICD-10-CM | POA: Diagnosis not present

## 2022-08-31 DIAGNOSIS — D225 Melanocytic nevi of trunk: Secondary | ICD-10-CM | POA: Diagnosis not present

## 2022-08-31 DIAGNOSIS — L821 Other seborrheic keratosis: Secondary | ICD-10-CM | POA: Diagnosis not present

## 2022-08-31 DIAGNOSIS — L570 Actinic keratosis: Secondary | ICD-10-CM | POA: Diagnosis not present

## 2022-08-31 DIAGNOSIS — Z85828 Personal history of other malignant neoplasm of skin: Secondary | ICD-10-CM | POA: Diagnosis not present

## 2022-11-09 ENCOUNTER — Encounter: Payer: Self-pay | Admitting: Family Medicine

## 2022-11-09 DIAGNOSIS — E7800 Pure hypercholesterolemia, unspecified: Secondary | ICD-10-CM | POA: Diagnosis not present

## 2022-11-09 DIAGNOSIS — E559 Vitamin D deficiency, unspecified: Secondary | ICD-10-CM | POA: Diagnosis not present

## 2022-11-09 DIAGNOSIS — Z79899 Other long term (current) drug therapy: Secondary | ICD-10-CM | POA: Diagnosis not present

## 2022-11-09 DIAGNOSIS — I77810 Thoracic aortic ectasia: Secondary | ICD-10-CM | POA: Diagnosis not present

## 2022-11-10 ENCOUNTER — Other Ambulatory Visit: Payer: Self-pay | Admitting: Family Medicine

## 2022-11-10 ENCOUNTER — Encounter: Payer: Self-pay | Admitting: Family Medicine

## 2022-11-10 DIAGNOSIS — I77810 Thoracic aortic ectasia: Secondary | ICD-10-CM

## 2022-11-16 ENCOUNTER — Ambulatory Visit
Admission: RE | Admit: 2022-11-16 | Discharge: 2022-11-16 | Disposition: A | Discharge status: Home / Self Care | Payer: Medicare Other | Source: Ambulatory Visit | Attending: Family Medicine | Admitting: Family Medicine

## 2022-11-16 DIAGNOSIS — I77810 Thoracic aortic ectasia: Secondary | ICD-10-CM

## 2022-11-16 DIAGNOSIS — I7121 Aneurysm of the ascending aorta, without rupture: Secondary | ICD-10-CM | POA: Diagnosis not present

## 2022-11-16 MED ORDER — IOPAMIDOL (ISOVUE-370) INJECTION 76%
75.0000 mL | Freq: Once | INTRAVENOUS | Status: AC | PRN
  Administered 2022-11-16: 75 mL via INTRAVENOUS

## 2022-12-09 DIAGNOSIS — H2513 Age-related nuclear cataract, bilateral: Secondary | ICD-10-CM | POA: Diagnosis not present

## 2023-02-16 DIAGNOSIS — D122 Benign neoplasm of ascending colon: Secondary | ICD-10-CM | POA: Diagnosis not present

## 2023-02-16 DIAGNOSIS — Z860101 Personal history of adenomatous and serrated colon polyps: Secondary | ICD-10-CM | POA: Diagnosis not present

## 2023-02-16 DIAGNOSIS — Z09 Encounter for follow-up examination after completed treatment for conditions other than malignant neoplasm: Secondary | ICD-10-CM | POA: Diagnosis not present

## 2023-02-16 DIAGNOSIS — D123 Benign neoplasm of transverse colon: Secondary | ICD-10-CM | POA: Diagnosis not present

## 2023-02-16 DIAGNOSIS — K648 Other hemorrhoids: Secondary | ICD-10-CM | POA: Diagnosis not present

## 2023-02-18 DIAGNOSIS — D123 Benign neoplasm of transverse colon: Secondary | ICD-10-CM | POA: Diagnosis not present

## 2023-02-18 DIAGNOSIS — D122 Benign neoplasm of ascending colon: Secondary | ICD-10-CM | POA: Diagnosis not present

## 2023-02-24 DIAGNOSIS — L570 Actinic keratosis: Secondary | ICD-10-CM | POA: Diagnosis not present

## 2023-02-24 DIAGNOSIS — Z85820 Personal history of malignant melanoma of skin: Secondary | ICD-10-CM | POA: Diagnosis not present

## 2023-02-24 DIAGNOSIS — L905 Scar conditions and fibrosis of skin: Secondary | ICD-10-CM | POA: Diagnosis not present

## 2023-02-24 DIAGNOSIS — Z85828 Personal history of other malignant neoplasm of skin: Secondary | ICD-10-CM | POA: Diagnosis not present

## 2023-02-24 DIAGNOSIS — L821 Other seborrheic keratosis: Secondary | ICD-10-CM | POA: Diagnosis not present

## 2023-02-24 DIAGNOSIS — D225 Melanocytic nevi of trunk: Secondary | ICD-10-CM | POA: Diagnosis not present

## 2023-05-17 DIAGNOSIS — I77810 Thoracic aortic ectasia: Secondary | ICD-10-CM | POA: Diagnosis not present

## 2023-05-17 DIAGNOSIS — G4700 Insomnia, unspecified: Secondary | ICD-10-CM | POA: Diagnosis not present

## 2023-05-17 DIAGNOSIS — Z79899 Other long term (current) drug therapy: Secondary | ICD-10-CM | POA: Diagnosis not present

## 2023-05-17 DIAGNOSIS — G4733 Obstructive sleep apnea (adult) (pediatric): Secondary | ICD-10-CM | POA: Diagnosis not present

## 2023-05-17 DIAGNOSIS — E7800 Pure hypercholesterolemia, unspecified: Secondary | ICD-10-CM | POA: Diagnosis not present

## 2023-05-17 DIAGNOSIS — Z0001 Encounter for general adult medical examination with abnormal findings: Secondary | ICD-10-CM | POA: Diagnosis not present

## 2023-05-17 DIAGNOSIS — I10 Essential (primary) hypertension: Secondary | ICD-10-CM | POA: Diagnosis not present

## 2023-05-26 DIAGNOSIS — I10 Essential (primary) hypertension: Secondary | ICD-10-CM | POA: Diagnosis not present

## 2023-05-26 DIAGNOSIS — G4733 Obstructive sleep apnea (adult) (pediatric): Secondary | ICD-10-CM | POA: Diagnosis not present

## 2023-05-31 DIAGNOSIS — Z79899 Other long term (current) drug therapy: Secondary | ICD-10-CM | POA: Diagnosis not present

## 2023-09-01 DIAGNOSIS — G4733 Obstructive sleep apnea (adult) (pediatric): Secondary | ICD-10-CM | POA: Diagnosis not present

## 2023-09-29 DIAGNOSIS — Z85828 Personal history of other malignant neoplasm of skin: Secondary | ICD-10-CM | POA: Diagnosis not present

## 2023-09-29 DIAGNOSIS — L570 Actinic keratosis: Secondary | ICD-10-CM | POA: Diagnosis not present

## 2023-09-29 DIAGNOSIS — L905 Scar conditions and fibrosis of skin: Secondary | ICD-10-CM | POA: Diagnosis not present

## 2023-09-29 DIAGNOSIS — L821 Other seborrheic keratosis: Secondary | ICD-10-CM | POA: Diagnosis not present

## 2023-09-29 DIAGNOSIS — D225 Melanocytic nevi of trunk: Secondary | ICD-10-CM | POA: Diagnosis not present

## 2023-09-29 DIAGNOSIS — Z85820 Personal history of malignant melanoma of skin: Secondary | ICD-10-CM | POA: Diagnosis not present

## 2023-11-17 DIAGNOSIS — Z79899 Other long term (current) drug therapy: Secondary | ICD-10-CM | POA: Diagnosis not present

## 2023-12-06 ENCOUNTER — Other Ambulatory Visit: Payer: Self-pay | Admitting: Family Medicine

## 2023-12-06 DIAGNOSIS — I77810 Thoracic aortic ectasia: Secondary | ICD-10-CM

## 2023-12-07 ENCOUNTER — Encounter: Payer: Self-pay | Admitting: Radiology

## 2023-12-08 ENCOUNTER — Ambulatory Visit
Admission: RE | Admit: 2023-12-08 | Discharge: 2023-12-08 | Disposition: A | Discharge status: Home / Self Care | Source: Ambulatory Visit | Attending: Family Medicine | Admitting: Family Medicine

## 2023-12-08 DIAGNOSIS — I77810 Thoracic aortic ectasia: Secondary | ICD-10-CM

## 2023-12-08 MED ORDER — IOPAMIDOL (ISOVUE-370) INJECTION 76%
75.0000 mL | Freq: Once | INTRAVENOUS | Status: AC | PRN
  Administered 2023-12-08: 75 mL via INTRAVENOUS

## 2023-12-19 ENCOUNTER — Encounter: Payer: Self-pay | Admitting: Medical Oncology

## 2023-12-19 NOTE — Progress Notes (Unsigned)
 Rapid Diagnostic Service for Malignancy [facility] [phone]   [phone]  INITIAL CONSULTATION:  Patient Care Team: Regino Slater, MD as PCP - General (Family Medicine) [name], RN as Oncology Nurse Navigator (Medical Oncology)  CHIEF COMPLAINTS/PURPOSE OF CONSULTATION:  Bilateral axillary adenopathy  HISTORY OF PRESENTING ILLNESS:  Russell Carney 68 y.o. male with medical history significant for sleep apnea, hypertension, diverticulosis and arthritis presents to the rapid diagnostic clinic for evaluation for bilateral axillary lymphadenopathy.  He is unaccompanied for this visit.  On review of the previous records, Mr. Chenault underwent annual surveillance with CTA chest due to thoracic aortic aneurysm on 11/16/2023 and incidentally found bilateral axillary adenopathy. Repeat CTA chest from 12/08/2023 again showed bilateral axillary adenopathy.   On exam today, Mr. Conard reports his energy and appetite are unchanged.  He is able to complete all his daily activities on his own.  He reports having skin irritation in his underarm region without a rash. He changed deodorants with no improvement.  He denies any palpable lumps or bumps.  He denies nausea, vomiting or bowel habit changes.  He denies easy bruising or signs of active bleeding.  He denies fevers, chills, night sweats, shortness of breath, chest pain or cough.  He has no other complaints.  Rest of the 10 point ROS is below.   MEDICAL HISTORY:  Past Medical History:  Diagnosis Date   Arthritis    in his knees   Diverticulosis    Hepatitis    had hep A   about 28 yrs ago   Hypertension    Pancreatitis    Sleep apnea    wears CPAP, setting = 4    SURGICAL HISTORY: Past Surgical History:  Procedure Laterality Date   ANTERIOR CERVICAL DECOMP/DISCECTOMY FUSION N/A 04/24/2014   Procedure: ANTERIOR CERVICAL DISCECTOMY FUSION C6 - C7 1 LEVEL;  Surgeon: Donaciano Sprang, MD;   Location: [facility] OR;  Service: Orthopedics;  Laterality: N/A;   CERVICAL DISCECTOMY  04/24/2014   C6 C7   CHOLECYSTECTOMY N/A 09/19/2015   Procedure: LAPAROSCOPIC CHOLECYSTECTOMY WITH INTRAOPERATIVE CHOLANGIOGRAM;  Surgeon: Krystal Russell, MD;  Location: [facility] OR;  Service: General;  Laterality: N/A;   COLONOSCOPY     x 4. Dr. Celestia in [place]. Remote history of polyps.    EYE SURGERY     lasik back in 2014   KNEE ARTHROSCOPY     x 3   TONSILLECTOMY     VASECTOMY      SOCIAL HISTORY: Social History   Socioeconomic History   Marital status: Married    Spouse name: Not on file   Number of children: Not on file   Years of education: Not on file   Highest education level: Not on file  Occupational History   Occupation: lab tech  Tobacco Use   Smoking status: Never   Smokeless tobacco: Never  Substance and Sexual Activity   Alcohol use: Yes    Alcohol/week: 0.0 standard drinks of alcohol    Comment: rare mixed drink on a Saturday evening    Drug use: No   Sexual activity: Not Currently  Other Topics Concern   Not on file  Social History Narrative   Not on file   Social Drivers of Health   Financial Resource Strain: Not on file  Food Insecurity: No Food Insecurity (12/20/2023)   Hunger Vital Sign    Worried About Running Out of Food in the Last Year: Never true  Ran Out of Food in the Last Year: Never true  Transportation Needs: No Transportation Needs (12/20/2023)   PRAPARE - Administrator, Civil Service (Medical): No    Lack of Transportation (Non-Medical): No  Physical Activity: Not on file  Stress: Not on file  Social Connections: Not on file  Intimate Partner Violence: Not At Risk (12/20/2023)   Humiliation, Afraid, Rape, and Kick questionnaire    Fear of Current or Ex-Partner: No    Emotionally Abused: No    Physically Abused: No    Sexually Abused: No    FAMILY HISTORY: Family History  Problem Relation Age of Onset   Colon cancer Father         mid 63s   Cancer Sister        ovarian   Hypertension Sister    Leukemia Brother    Pancreatic cancer Neg Hx     ALLERGIES:  is allergic to hctz [hydrochlorothiazide ] and lisinopril.  MEDICATIONS:  Current Outpatient Medications  Medication Sig Dispense Refill   Ascorbic Acid (ACEROLA-C PO) Take 125 mg by mouth daily.     aspirin  EC 81 MG tablet Take 81 mg by mouth every other day.     cholecalciferol (VITAMIN D) 1000 UNITS tablet Take 5,000 Units by mouth daily.      Coenzyme Q10 (COQ10) 100 MG CAPS Take 1 capsule by mouth daily.     folic acid (FOLVITE) 400 MCG tablet Take 400 mcg by mouth daily.     losartan  (COZAAR ) 25 MG tablet Take 25 mg by mouth daily.     Menthol , Topical Analgesic, (BLUE-EMU MAXIMUM STRENGTH EX) Apply 1 application topically daily as needed (knee pain).     Multiple Vitamin (MULTIVITAMIN WITH MINERALS) TABS tablet Take 1 tablet by mouth daily.     Olive Leaf Extract 500 MG CAPS Take 1 capsule by mouth daily.     Probiotic Product (PROBIOTIC DAILY) CAPS Take 1 capsule by mouth daily.     simvastatin  (ZOCOR ) 20 MG tablet Take 20 mg by mouth daily at 6 PM.     zolpidem  (AMBIEN ) 10 MG tablet Take 10 mg by mouth at bedtime as needed for sleep.     celecoxib (CELEBREX) 200 MG capsule Take 200 mg by mouth 3 (three) times a week.  (Patient not taking: Reported on 12/20/2023)     Glucosamine-Chondroit-Vit C-Mn (GLUCOSAMINE 1500 COMPLEX) CAPS Take 1 capsule by mouth daily. (Patient not taking: Reported on 12/20/2023)     ibuprofen  (ADVIL ,MOTRIN ) 800 MG tablet Take 1 tablet (800 mg total) by mouth 3 (three) times daily. (Patient not taking: Reported on 12/20/2023) 21 tablet 0   lipase/protease/amylase (CREON ) 12000 units CPEP capsule Take 2 capsules (24,000 Units total) by mouth 3 (three) times daily before meals. (Patient not taking: Reported on 12/20/2023) 270 capsule 1   ondansetron  (ZOFRAN ) 4 MG tablet Take 1 tablet (4 mg total) by mouth every 4 (four) hours as needed for  nausea. (Patient not taking: Reported on 12/20/2023) 20 tablet 0   oxyCODONE  (OXY IR/ROXICODONE ) 5 MG immediate release tablet Take 1-2 tablets (5-10 mg total) by mouth every 4 (four) hours as needed for moderate pain, severe pain or breakthrough pain. (Patient not taking: Reported on 12/20/2023) 40 tablet 0   No current facility-administered medications for this visit.    REVIEW OF SYSTEMS:   Constitutional: ( - ) fevers, ( - )  chills , ( - ) night sweats Eyes: ( - ) blurriness of vision, ( - )  double vision, ( - ) watery eyes Ears, nose, mouth, throat, and face: ( - ) mucositis, ( - ) sore throat Respiratory: ( - ) cough, ( - ) dyspnea, ( - ) wheezes Cardiovascular: ( - ) palpitation, ( - ) chest discomfort, ( - ) lower extremity swelling Gastrointestinal:  ( - ) nausea, ( - ) heartburn, ( - ) change in bowel habits Skin: ( - ) abnormal skin rashes Lymphatics: ( - ) new lymphadenopathy, ( - ) easy bruising Neurological: ( - ) numbness, ( - ) tingling, ( - ) new weaknesses Behavioral/Psych: ( - ) mood change, ( - ) new changes  All other systems were reviewed with the patient and are negative.  PHYSICAL EXAMINATION: ECOG PERFORMANCE STATUS: 1 - Symptomatic but completely ambulatory  Vitals:   12/20/23 1200 12/20/23 1201  BP: (!) 146/80 136/74  Pulse: 98   Resp: 18   Temp: 97.7 F (36.5 C)   SpO2: 95%    Filed Weights   12/20/23 1200  Weight: 225 lb 9.6 oz (102.3 kg)    GENERAL: well appearing male in NAD  SKIN: skin color, texture, turgor are normal, no rashes or significant lesions EYES: conjunctiva are pink and non-injected, sclera clear OROPHARYNX: no exudate, no erythema; lips, buccal mucosa, and tongue normal  NECK: supple, non-tender LYMPH:  no palpable lymphadenopathy in the cervical, axillary or supraclavicular lymph nodes.  LUNGS: clear to auscultation and percussion with normal breathing effort HEART: regular rate & rhythm and no murmurs and no lower extremity  edema ABDOMEN: soft, non-tender, non-distended, normal bowel sounds Musculoskeletal: no cyanosis of digits and no clubbing  PSYCH: alert & oriented x 3, fluent speech NEURO: no focal motor/sensory deficits  LABORATORY DATA:  I have reviewed the data as listed    Latest Ref Rng & Units 12/20/2023    1:12 PM 09/12/2015    3:33 PM 07/29/2015    4:11 AM  CBC  WBC 4.0 - 10.5 K/uL 7.4  6.2  8.0   Hemoglobin 13.0 - 17.0 g/dL 82.8  83.8  85.6   Hematocrit 39.0 - 52.0 % 47.5  46.5  41.8   Platelets 150 - 400 K/uL 174  178  137        Latest Ref Rng & Units 12/20/2023    1:12 PM 09/12/2015    3:33 PM 07/29/2015    4:11 AM  CMP  Glucose 70 - 99 mg/dL 99  875  95   BUN 8 - 23 mg/dL 18  13  10    Creatinine 0.61 - 1.24 mg/dL 8.93  8.79  9.34   Sodium 135 - 145 mmol/L 141  139  137   Potassium 3.5 - 5.1 mmol/L 4.6  4.0  3.7   Chloride 98 - 111 mmol/L 100  105  105   CO2 22 - 32 mmol/L 30  25  28    Calcium 8.9 - 10.3 mg/dL 9.5  9.2  8.1   Total Protein 6.5 - 8.1 g/dL 7.2  6.6  5.7   Total Bilirubin 0.0 - 1.2 mg/dL 0.4  0.7  1.2   Alkaline Phos 38 - 126 U/L 68  64  61   AST 15 - 41 U/L 25  22  22    ALT 0 - 44 U/L 39  24  52      RADIOGRAPHIC STUDIES: I have personally reviewed the radiological images as listed and agreed with the findings in the report. CT ANGIO CHEST AORTA W/CM &  OR WO/CM Result Date: 12/11/2023 CLINICAL DATA:  Thoracic aortic ectasia EXAM: CT ANGIOGRAPHY CHEST WITH CONTRAST TECHNIQUE: Multidetector CT imaging of the chest was performed using the standard protocol during bolus administration of intravenous contrast. Multiplanar CT image reconstructions and MIPs were obtained to evaluate the vascular anatomy. RADIATION DOSE REDUCTION: This exam was performed according to the departmental dose-optimization program which includes automated exposure control, adjustment of the mA and/or kV according to patient size and/or use of iterative reconstruction technique. CONTRAST:  75mL  ISOVUE -370 IOPAMIDOL  (ISOVUE -370) INJECTION 76% COMPARISON:  CTA chest, 11/16/2022.  Chest XR, 02/06/2019. FINDINGS: CARDIOVASCULAR: Limitations by motion: Moderate Preferential opacification of the thoracic aorta. No evidence of thoracic aortic dissection. Aortic Root: --Valve: 2.6 cm --Sinuses: 4.0 cm --Sinotubular Junction: 2.8 cm Thoracic Aorta: --Ascending Aorta: 4.0 cm --Aortic Arch: 2.9 cm --Descending Aorta: 2.5 cm Other: Normal heart size.  No pericardial effusion. Conventional 3-sided LEFT aortic arch. No atherosclerosis or hemodynamic stenosis at the origins of the great vessels. Mediastinum/Nodes: Persistent prominence of BILATERAL axillary adenopathy. No enlarged mediastinal, or hilar lymph nodes. Thyroid  gland, trachea, and esophagus demonstrate no significant findings. Lungs/Pleura: Lungs are clear without focal consolidation, mass or suspicious pulmonary nodule. No pleural effusion or pneumothorax. Upper Abdomen: No acute abnormality. Musculoskeletal: Cervical fusion hardware, incompletely imaged. No acute chest wall abnormality or significant osseous findings. Review of the MIP images confirms the above findings. IMPRESSION: 1. 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 Continue annual imaging followup by CTA or MRA. This recommendation follows 2010 ACCF/AHA/AATS/ACR/ASA/SCA/SCAI/SIR/STS/SVM Guidelines for the Diagnosis and Management of Patients with Thoracic Aortic Disease. Circulation. 2010; 121: Z733-z630. Aortic aneurysm NOS (ICD10-I71.9) 2. Persistent prominence of BILATERAL axillary adenopathy. Findings suspicious for potential smoldering lymphoproliferative process. Consider referral to [facility] rapid diagnostic [facility]) clinic for further evaluation These results will be called to the ordering clinician or representative by the Radiologist Assistant, and communication documented in the PACS or [facility] Dashboard. Electronically Signed   By: Thom Hall M.D.    On: 12/11/2023 08:10    ASSESSMENT & PLAN Russell Carney is a 68 y.o. male who presents to the rapid diagnostic clinic for evaluation for bilateral axillary lymphadenopathy.  #Bilateral axillary lymphadenopathy: -- Seen on CTA chest from 11/16/2022 and 12/08/2023. --Differentials include infectious process, inflammatory process, lymphoproliferative disorder or metastatic disease.  --Labs today to check CBC, CMP, LDH, flow cytometry, ESR and CRP levels --Will obtain PET/CT scan to further evaluate lymphadenopathy prior to determining need for biopsy.  --RTC once workup is complete.    Orders Placed This Encounter  Procedures   NM PET Image Initial (PI) Skull Base To Thigh    Standing Status:   Future    Expected Date:   12/21/2023    Expiration Date:   12/19/2024    If indicated for the ordered procedure, I authorize the administration of a radiopharmaceutical per Radiology protocol:   Yes    Preferred imaging location?:   [facility]   CBC with Differential (Cancer Center Only)    Standing Status:   Future    Number of Occurrences:   1    Expiration Date:   12/19/2024   CMP (Cancer Center only)    Standing Status:   Future    Number of Occurrences:   1    Expiration Date:   12/19/2024   Lactate dehydrogenase (LDH)    Standing Status:   Future    Number of Occurrences:   1  Expiration Date:   12/19/2024   Sedimentation rate    Standing Status:   Future    Number of Occurrences:   1    Expiration Date:   12/19/2024   C-reactive protein    Standing Status:   Future    Number of Occurrences:   1    Expiration Date:   12/19/2024   Flow Cytometry, Peripheral Blood (Oncology)    Standing Status:   Future    Number of Occurrences:   1    Expiration Date:   12/19/2024    All questions were answered. The patient knows to call the clinic with any problems, questions or concerns.  I have spent a total of 60 minutes minutes of face-to-face and non-face-to-face time, preparing to see  the patient, obtaining and/or reviewing separately obtained history, performing a medically appropriate examination, counseling and educating the patient, ordering tests/procedures,  documenting clinical information in the electronic health record, independently interpreting results and communicating results to the patient, and care coordination.   Johnston Police, PA-C Department of Hematology/Oncology [facility] Phone: [phone]

## 2023-12-20 ENCOUNTER — Inpatient Hospital Stay: Attending: Physician Assistant | Admitting: Physician Assistant

## 2023-12-20 ENCOUNTER — Inpatient Hospital Stay

## 2023-12-20 ENCOUNTER — Encounter: Payer: Self-pay | Admitting: Medical Oncology

## 2023-12-20 ENCOUNTER — Encounter: Payer: Self-pay | Admitting: Physician Assistant

## 2023-12-20 VITALS — BP 136/74 | HR 98 | Temp 97.7°F | Resp 18 | Wt 225.6 lb

## 2023-12-20 DIAGNOSIS — G4730 Sleep apnea, unspecified: Secondary | ICD-10-CM | POA: Diagnosis not present

## 2023-12-20 DIAGNOSIS — I10 Essential (primary) hypertension: Secondary | ICD-10-CM | POA: Insufficient documentation

## 2023-12-20 DIAGNOSIS — L988 Other specified disorders of the skin and subcutaneous tissue: Secondary | ICD-10-CM | POA: Insufficient documentation

## 2023-12-20 DIAGNOSIS — R591 Generalized enlarged lymph nodes: Secondary | ICD-10-CM

## 2023-12-20 DIAGNOSIS — R590 Localized enlarged lymph nodes: Secondary | ICD-10-CM | POA: Insufficient documentation

## 2023-12-20 DIAGNOSIS — D46Z Other myelodysplastic syndromes: Secondary | ICD-10-CM | POA: Insufficient documentation

## 2023-12-20 DIAGNOSIS — Z79899 Other long term (current) drug therapy: Secondary | ICD-10-CM | POA: Insufficient documentation

## 2023-12-20 LAB — CMP (CANCER CENTER ONLY)
ALT: 39 U/L (ref 0–44)
AST: 25 U/L (ref 15–41)
Albumin: 4.7 g/dL (ref 3.5–5.0)
Alkaline Phosphatase: 68 U/L (ref 38–126)
Anion gap: 10 (ref 5–15)
BUN: 18 mg/dL (ref 8–23)
CO2: 30 mmol/L (ref 22–32)
Calcium: 9.5 mg/dL (ref 8.9–10.3)
Chloride: 100 mmol/L (ref 98–111)
Creatinine: 1.06 mg/dL (ref 0.61–1.24)
GFR, Estimated: >60 mL/min (ref >60)
Glucose, Bld: 99 mg/dL (ref 70–99)
Potassium: 4.6 mmol/L (ref 3.5–5.1)
Sodium: 141 mmol/L (ref 135–145)
Total Bilirubin: 0.4 mg/dL (ref 0.0–1.2)
Total Protein: 7.2 g/dL (ref 6.5–8.1)

## 2023-12-20 LAB — CBC WITH DIFFERENTIAL (CANCER CENTER ONLY)
Abs Immature Granulocytes: 0.02 K/uL (ref 0.00–0.07)
Basophils Absolute: 0 K/uL (ref 0.0–0.1)
Basophils Relative: 0 %
Eosinophils Absolute: 0 K/uL (ref 0.0–0.5)
Eosinophils Relative: 0 %
HCT: 47.5 % (ref 39.0–52.0)
Hemoglobin: 17.1 g/dL — ABNORMAL HIGH (ref 13.0–17.0)
Immature Granulocytes: 0 %
Lymphocytes Relative: 39 %
Lymphs Abs: 2.9 K/uL (ref 0.7–4.0)
MCH: 31.5 pg (ref 26.0–34.0)
MCHC: 36 g/dL (ref 30.0–36.0)
MCV: 87.5 fL (ref 80.0–100.0)
Monocytes Absolute: 0.8 K/uL (ref 0.1–1.0)
Monocytes Relative: 11 %
Neutro Abs: 3.7 K/uL (ref 1.7–7.7)
Neutrophils Relative %: 50 %
Platelet Count: 174 K/uL (ref 150–400)
RBC: 5.43 MIL/uL (ref 4.22–5.81)
RDW: 12.7 % (ref 11.5–15.5)
WBC Count: 7.4 K/uL (ref 4.0–10.5)
nRBC: 0 % (ref 0.0–0.2)

## 2023-12-20 LAB — C-REACTIVE PROTEIN: CRP: 0.9 mg/dL (ref <1.0)

## 2023-12-20 LAB — LACTATE DEHYDROGENASE: LDH: 181 U/L (ref 105–235)

## 2023-12-20 LAB — SEDIMENTATION RATE: Sed Rate: 4 mm/h (ref 0–16)

## 2023-12-20 NOTE — Patient Instructions (Signed)
 Rapid Diagnostic Service Visit Discharge Information and Instructions  Thank you for choosing [facility] for your healthcare needs.  Below is a summary of today's discussion, along with our contact information and an outline of what to expect next.  Reason for Visit:  Axillary Lymphadenopathy  Proposed Diagnostic Care Plan: Labs today PET/CT scan +/- biopsy  What to Expect: - Generally, when lab tests are ordered the results can take up to 1 week for results to be available.  At that point, we will contact you to discuss your results with you.  Unless there is a critical result, we will typically wait for all of your lab results to be available before contacting you. - If a biopsy is part of your Care Plan, those results can take on average 7-10 days to result.  Once results are available, we will contact you to discuss your pathology results and any next steps. - If you have additional imaging ordered, such as a CT Scan, MRI, Ultrasound, Bone Scan, or PET scan, your imaging will need to be authorized then scheduled with the earliest available appointment.  You may be asked to travel to another hospital within [facility] who has a sooner availability, please consider doing so if asked. - If you use MyChart, your results will be available to you in the MyChart portal.  Your provider will be in touch with you as soon as all of your results are available to be discussed.  Your Diagnostic Clinic Provider:  Johnston Police PA-C and Dr. Norleen Kidney, contact number [phone] Your Diagnostic Navigator:  Colene Raider RN, contact number [phone]  If you or your caregiver have number blocking on your cell phones, please ensure the cancer center's numbers are not blocked.  If you are not a registered MyChart user, please consider enrolling in MyChart to receive your test results and visit notes.  You can also access your discharge instructions electronically.  MyChart also gives you an  electronic means to communicate with your Care Team instead of needing to call in to the cancer center.  We appreciate you trusting us  with your healthcare and look forward to partnering with you as we work to uncover what your potential diagnosis may be.  Please do not hesitate to reach out at any point with questions or concerns.

## 2023-12-20 NOTE — Progress Notes (Signed)
 Rapid Diagnostic Services  Patient presented to clinic, alone, for his scheduled appointment with PA-C Johnston. I introduced myself and provided them with my direct contact information. Patient was encouraged to call me with any questions/concerns he may have.  Colene KYM Raider, RN, BSN Oncology Nurse Navigator, Rapid Diagnostic Services 12/20/2023 1:15 PM

## 2023-12-22 ENCOUNTER — Encounter (HOSPITAL_COMMUNITY)
Admission: RE | Admit: 2023-12-22 | Discharge: 2023-12-22 | Disposition: A | Source: Ambulatory Visit | Attending: Physician Assistant

## 2023-12-22 DIAGNOSIS — N2889 Other specified disorders of kidney and ureter: Secondary | ICD-10-CM | POA: Diagnosis not present

## 2023-12-22 DIAGNOSIS — J9811 Atelectasis: Secondary | ICD-10-CM | POA: Diagnosis not present

## 2023-12-22 DIAGNOSIS — N200 Calculus of kidney: Secondary | ICD-10-CM | POA: Insufficient documentation

## 2023-12-22 DIAGNOSIS — R591 Generalized enlarged lymph nodes: Secondary | ICD-10-CM | POA: Diagnosis present

## 2023-12-22 DIAGNOSIS — N281 Cyst of kidney, acquired: Secondary | ICD-10-CM | POA: Insufficient documentation

## 2023-12-22 DIAGNOSIS — K5730 Diverticulosis of large intestine without perforation or abscess without bleeding: Secondary | ICD-10-CM | POA: Diagnosis not present

## 2023-12-22 DIAGNOSIS — I700 Atherosclerosis of aorta: Secondary | ICD-10-CM | POA: Insufficient documentation

## 2023-12-22 DIAGNOSIS — I7091 Generalized atherosclerosis: Secondary | ICD-10-CM | POA: Diagnosis not present

## 2023-12-22 DIAGNOSIS — K760 Fatty (change of) liver, not elsewhere classified: Secondary | ICD-10-CM | POA: Diagnosis not present

## 2023-12-22 LAB — FLOW CYTOMETRY

## 2023-12-22 LAB — GLUCOSE, CAPILLARY: Glucose-Capillary: 96 mg/dL (ref 70–99)

## 2023-12-22 LAB — SURGICAL PATHOLOGY

## 2023-12-22 MED ORDER — FLUDEOXYGLUCOSE F - 18 (FDG) INJECTION
10.7500 | Freq: Once | INTRAVENOUS | Status: AC
  Administered 2023-12-22: 10.75 via INTRAVENOUS

## 2023-12-23 ENCOUNTER — Inpatient Hospital Stay

## 2023-12-23 ENCOUNTER — Other Ambulatory Visit: Payer: Self-pay | Admitting: *Deleted

## 2023-12-23 ENCOUNTER — Telehealth: Payer: Self-pay | Admitting: Physician Assistant

## 2023-12-23 DIAGNOSIS — R591 Generalized enlarged lymph nodes: Secondary | ICD-10-CM

## 2023-12-23 DIAGNOSIS — R590 Localized enlarged lymph nodes: Secondary | ICD-10-CM | POA: Diagnosis not present

## 2023-12-23 NOTE — Telephone Encounter (Signed)
 I called Mr. Russell Carney to review the lab results and PET scan results. 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 PET scan showed Deauville 2 and 3 lymphadenopathy which is suspicious for low grade lymphoma.   Discussed with Dr. Federico who recommends CLL FISH panel to evaluate for CLL versus other B cell lymphoma. If CLL FISH Panel is non-diagnostic, we will pursue a excisional biopsy.   Russell Carney expressed understanding of the plan provided.

## 2023-12-26 ENCOUNTER — Encounter (HOSPITAL_COMMUNITY): Payer: Self-pay | Admitting: Physician Assistant

## 2024-01-03 LAB — FISH HES LEUKEMIA, 4Q12 REA

## 2024-01-05 ENCOUNTER — Encounter: Payer: Self-pay | Admitting: Physician Assistant

## 2024-01-06 ENCOUNTER — Telehealth: Payer: Self-pay | Admitting: Hematology and Oncology

## 2024-01-06 ENCOUNTER — Encounter: Payer: Self-pay | Admitting: Medical Oncology

## 2024-01-06 NOTE — Telephone Encounter (Signed)
 Scheduled patient for labs and follow-up in 6 months. Called and left a voicemail with the details.

## 2024-01-10 ENCOUNTER — Telehealth: Payer: Self-pay | Admitting: Physician Assistant

## 2024-01-10 NOTE — Telephone Encounter (Signed)
 I called and spoke to Mr. Russell Carney to review the lab results from 12/23/2023. 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 Since patient does not have any B-symptoms, bulky lymphadenopathy, splenomegaly or leukocytosis; the recommendation is surveillance at this time.  Dr. Federico will see the patient back in 6 months with repeat labs.

## 2024-07-06 ENCOUNTER — Inpatient Hospital Stay

## 2024-07-06 ENCOUNTER — Inpatient Hospital Stay: Admitting: Hematology and Oncology
